# Patient Record
Sex: Male | Born: 1990 | Race: Black or African American | Hispanic: No | Marital: Single | Smoking: Never smoker
Health system: Southern US, Community
[De-identification: ages and names within clinical notes are randomized; demographics above are authoritative.]

## PROBLEM LIST (undated history)

## (undated) DIAGNOSIS — J40 Bronchitis, not specified as acute or chronic: Secondary | ICD-10-CM

## (undated) DIAGNOSIS — F99 Mental disorder, not otherwise specified: Secondary | ICD-10-CM

## (undated) DIAGNOSIS — F419 Anxiety disorder, unspecified: Secondary | ICD-10-CM

## (undated) HISTORY — PX: NO PAST SURGERIES: SHX2092

---
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
## 2013-01-23 ENCOUNTER — Encounter (HOSPITAL_COMMUNITY): Payer: Self-pay | Admitting: Emergency Medicine

## 2013-01-23 ENCOUNTER — Emergency Department (HOSPITAL_COMMUNITY)
Admission: EM | Admit: 2013-01-23 | Discharge: 2013-01-23 | Disposition: A | Discharge status: Home / Self Care | Payer: Managed Care, Other (non HMO) | Source: Home / Self Care | Attending: Emergency Medicine | Admitting: Emergency Medicine

## 2013-01-23 DIAGNOSIS — J040 Acute laryngitis: Secondary | ICD-10-CM

## 2013-01-23 LAB — POCT RAPID STREP A: Streptococcus, Group A Screen (Direct): NEGATIVE

## 2013-01-23 MED ORDER — PREDNISONE 20 MG PO TABS: 20.0000 mg | ORAL_TABLET | Freq: Two times a day (BID) | ORAL | Status: DC

## 2013-01-23 MED ORDER — BENZONATATE 200 MG PO CAPS: 200.0000 mg | ORAL_CAPSULE | Freq: Three times a day (TID) | ORAL | Status: DC | PRN

## 2013-01-23 NOTE — ED Notes (Signed)
Throat issues, seen by dr Lorenz Coaster prior to this nurse

## 2013-01-23 NOTE — ED Provider Notes (Signed)
  Chief Complaint   No chief complaint on file.   History of Present Illness   Dale Hayden is a 22 year old male who has had a four-day history of hoarseness, slight sore throat, dry cough, nasal congestion, and headache. He denies any fever, chills, sweats, muscle aches, chest tightness, wheezing, chest pain, earache, or GI symptoms. He has had no known sick exposures.  Review of Systems   Other than as noted above, the patient denies any of the following symptoms: Systemic:  No fevers, chills, sweats, or myalgias. Eye:  No redness or discharge. ENT:  No ear pain, headache, nasal congestion, drainage, sinus pressure, or sore throat. Neck:  No neck pain, stiffness, or swollen glands. Lungs:  No cough, sputum production, hemoptysis, wheezing, chest tightness, shortness of breath or chest pain. GI:  No abdominal pain, nausea, vomiting or diarrhea.  PMFSH   Past medical history, family history, social history, meds, and allergies were reviewed.   Physical exam   Vital signs:  BP 129/82  Pulse 64  Temp(Src) 97.8 F (36.6 C) (Oral)  Resp 14  SpO2 99% General:  Alert and oriented.  In no distress.  Skin warm and dry. Eye:  No conjunctival injection or drainage. Lids were normal. ENT:  TMs and canals were normal, without erythema or inflammation.  Nasal mucosa was clear and uncongested, without drainage.  Mucous membranes were moist.  Pharynx was swollen and erythematous without exudate or drainage.  There were no oral ulcerations or lesions. Neck:  Supple, no adenopathy, tenderness or mass. Lungs:  No respiratory distress.  Lungs were clear to auscultation, without wheezes, rales or rhonchi.  Breath sounds were clear and equal bilaterally.  Heart:  Regular rhythm, without gallops, murmers or rubs. Skin:  Clear, warm, and dry, without rash or lesions.   Labs   Results for orders placed during the hospital encounter of 01/23/13  POCT RAPID STREP A ([facility] URG CARE ONLY)      Result  Value Range   Streptococcus, Group A Screen (Direct) NEGATIVE  NEGATIVE    Assessment     The encounter diagnosis was Laryngitis.  No indication for antibiotics.  Plan    1.  Meds:  The following meds were prescribed:   New Prescriptions   BENZONATATE (TESSALON) 200 MG CAPSULE    Take 1 capsule (200 mg total) by mouth 3 (three) times daily as needed for cough.   PREDNISONE (DELTASONE) 20 MG TABLET    Take 1 tablet (20 mg total) by mouth 2 (two) times daily.    2.  Patient Education/Counseling:  The patient was given appropriate handouts, self care instructions, and instructed in symptomatic relief.  Instructed to get extra fluids, rest, and use a cool mist vaporizer.    3.  Follow up:  The patient was told to follow up here if no better in 3 to 4 days, or sooner if becoming worse in any way, and given some red flag symptoms such as increasing fever, difficulty breathing, chest pain, or persistent vomiting which would prompt immediate return.  Follow up here as needed.      Reuben Likes, MD 01/23/13 [phone]

## 2013-01-25 LAB — CULTURE, GROUP A STREP

## 2013-12-17 ENCOUNTER — Other Ambulatory Visit (HOSPITAL_COMMUNITY)
Admission: RE | Admit: 2013-12-17 | Discharge: 2013-12-17 | Disposition: A | Discharge status: Home / Self Care | Payer: Managed Care, Other (non HMO) | Source: Ambulatory Visit | Attending: Emergency Medicine | Admitting: Emergency Medicine

## 2013-12-17 ENCOUNTER — Emergency Department (HOSPITAL_COMMUNITY)
Admission: EM | Admit: 2013-12-17 | Discharge: 2013-12-17 | Disposition: A | Discharge status: Home / Self Care | Payer: Managed Care, Other (non HMO) | Source: Home / Self Care | Attending: Emergency Medicine | Admitting: Emergency Medicine

## 2013-12-17 ENCOUNTER — Encounter (HOSPITAL_COMMUNITY): Payer: Self-pay | Admitting: Emergency Medicine

## 2013-12-17 DIAGNOSIS — Z113 Encounter for screening for infections with a predominantly sexual mode of transmission: Secondary | ICD-10-CM | POA: Insufficient documentation

## 2013-12-17 DIAGNOSIS — L03032 Cellulitis of left toe: Secondary | ICD-10-CM

## 2013-12-17 LAB — POCT URINALYSIS DIP (DEVICE)
Bilirubin Urine: NEGATIVE
Glucose, UA: NEGATIVE mg/dL
Hgb urine dipstick: NEGATIVE
KETONES UR: NEGATIVE mg/dL
LEUKOCYTES UA: NEGATIVE
Nitrite: NEGATIVE
PH: 6.5 (ref 5.0–8.0)
PROTEIN: NEGATIVE mg/dL
SPECIFIC GRAVITY, URINE: 1.025 (ref 1.005–1.030)
UROBILINOGEN UA: 0.2 mg/dL (ref 0.0–1.0)

## 2013-12-17 MED ORDER — HYDROCODONE-ACETAMINOPHEN 5-325 MG PO TABS: 1.0000 | ORAL_TABLET | Freq: Four times a day (QID) | ORAL | Status: DC | PRN

## 2013-12-17 MED ORDER — SULFAMETHOXAZOLE-TRIMETHOPRIM 800-160 MG PO TABS: 1.0000 | ORAL_TABLET | Freq: Two times a day (BID) | ORAL | Status: AC

## 2013-12-17 MED ORDER — LIDOCAINE HCL 2 % IJ SOLN
INTRAMUSCULAR | Status: AC
  Filled 2013-12-17: qty 20

## 2013-12-17 NOTE — ED Notes (Signed)
Bacitracin ointment applied to left great toe.  Wrapped with 2  inch kling gauze.  D/c instructions reviewed.

## 2013-12-17 NOTE — Discharge Instructions (Signed)
You have an infection around your big toe. We removed the toenail.  It will grow back in 6 months. Apply neosporin to the toe and cover with a bandage for the next 2-3 days. Take the antibiotic twice a day for 10 days.  If anything comes back in your urine we will call you. Get Lamisil over the counter for the spot on your hand.  Follow up as needed.  Toenail Removal Toenails may need to be removed because of injury, infections, or to correct abnormal growth. A special non-stick bandage will likely be put tightly on your toe to prevent bleeding. Often times a new nail will grow back. Sometimes the new nail may be deformed. Most of the time when a nail is lost, it will gradually heal, but may be sensitive for a long time. HOME CARE INSTRUCTIONS   Keep your foot elevated to relieve pain and swelling. This will require lying in bed or on a couch with the leg on pillows or sitting in a recliner with the leg up. Walking or letting your leg dangle may increase swelling, slow healing, and cause throbbing pain.  Keep your bandage dry and clean.  Change your bandage in 24 hours.  After your bandage is changed, soak your foot in warm, soapy water for 10 to 20 minutes. Do this 3 times per day. This helps reduce pain and swelling. After soaking your foot apply a clean, dry bandage. Change your bandage if it is wet or dirty.  Only take over-the-counter or prescription medicines for pain, discomfort, or fever as directed by your caregiver.  See your caregiver as needed for problems. You might need a tetanus shot now if:  You have no idea when you had the last one.  You have never had a tetanus shot before.  The injured area had dirt in it. If you need a tetanus shot, and you decide not to get one, there is a rare chance of getting tetanus. Sickness from tetanus can be serious. If you did get a tetanus shot, your arm may swell, get red and warm to the touch at the shot site. This is common and not a  problem. SEEK IMMEDIATE MEDICAL CARE IF:   You have increased pain, swelling, redness, warmth, drainage, or bleeding.  You have a fever.  You have swelling that spreads from your toe into your foot. Document Released: 10/09/2002 Document Revised: 04/04/2011 Document Reviewed: 01/21/2008 [facility] Patient Information 2015 [place]. This information is not intended to replace advice given to you by your health care provider. Make sure you discuss any questions you have with your health care provider.

## 2013-12-17 NOTE — ED Provider Notes (Signed)
CSN: [id number]     Arrival date & time 12/17/13  1718 History   First MD Initiated Contact with Patient 12/17/13 1733     Chief Complaint  Patient presents with  . Toe Pain   (Consider location/radiation/quality/duration/timing/severity/associated sxs/prior Treatment) HPI  He is a 23 year old man here for evaluation of left big toe pain. It has been present for about a month. The pain is only when he touches it or bumped his foot. He has noted swelling and redness of the toe.  He denies any drainage from the toe. No fevers or chills.  He also states that several weeks ago, he had pain in his left testicle. He denies any dysuria or penile discharge at that time. He states he took some over-the-counter medication and it resolved. He would like to be tested for STDs.  Past Medical History  Diagnosis Date  . Bronchitis   . Anxiety   . Mental disorder     ADHD   Past Surgical History  Procedure Laterality Date  . No past surgeries    . I&d extremity  06/16/2011    Procedure: IRRIGATION AND DEBRIDEMENT EXTREMITY;  Surgeon: Verlee RossettiSteven R Norris, MD;  [facility];  Service: Orthopedics;  Laterality: Left;  I&D left foot with removal of foreign body   History reviewed. No pertinent family history. History  Substance Use Topics  . Smoking status: Never Smoker   . Smokeless tobacco: Never Used  . Alcohol Use: Yes     Comment: RARE    Review of Systems  Constitutional: Negative.   Gastrointestinal: Negative.   Genitourinary: Negative for dysuria, discharge, penile pain and testicular pain.  Musculoskeletal:       Left great toe pain/swelling    Allergies  Peanut-containing drug products  Home Medications   Prior to Admission medications   Medication Sig Start Date End Date Taking? Authorizing Provider  benzonatate (TESSALON) 200 MG capsule Take 1 capsule (200 mg total) by mouth 3 (three) times daily as needed for cough. 01/23/13   Reuben Likesavid C Keller, MD  HYDROcodone-acetaminophen  (NORCO) 5-325 MG per tablet Take 1 tablet by mouth every 6 (six) hours as needed for moderate pain. 12/17/13   Charm RingsErin J Jayana Kotula, MD  menthol-cetylpyridinium (CEPACOL) 3 MG lozenge Take 1 lozenge by mouth as needed for sore throat.    Historical Provider, MD  predniSONE (DELTASONE) 20 MG tablet Take 1 tablet (20 mg total) by mouth 2 (two) times daily. 01/23/13   Reuben Likesavid C Keller, MD  sulfamethoxazole-trimethoprim (BACTRIM DS,SEPTRA DS) 800-160 MG per tablet Take 1 tablet by mouth 2 (two) times daily. 12/17/13 12/24/13  Charm RingsErin J Samhita Kretsch, MD  Zinc 10 MG LOZG Use as directed in the mouth or throat.    Historical Provider, MD   BP 125/73 mmHg  Pulse 73  Temp(Src) 98.1 F (36.7 C) (Oral)  Resp 18  SpO2 99% Physical Exam  Constitutional: He is oriented to person, place, and time. He appears well-developed and well-nourished. No distress.  Cardiovascular: Normal rate.   Pulmonary/Chest: Effort normal.  Neurological: He is alert and oriented to person, place, and time.  Skin:  Left great toe with swelling and erythema at medial and lateral nail bed as well as cuticle    ED Course  NAIL REMOVAL Date/Time: 12/17/2013 6:39 PM Performed by: Charm RingsHONIG, Linville Decarolis J Authorized by: Charm RingsHONIG, Lovetta Condie J Consent: Verbal consent obtained. Risks and benefits: risks, benefits and alternatives were discussed Consent given by: patient Patient understanding: patient states understanding of the  procedure being performed Patient identity confirmed: verbally with patient Time out: Immediately prior to procedure a "time out" was called to verify the correct patient, procedure, equipment, support staff and site/side marked as required. Location: left foot Location details: left big toe Anesthesia: digital block Local anesthetic: lidocaine 2% without epinephrine Anesthetic total: 4 ml Preparation: skin prepped with Betadine Amount removed: complete Nail bed sutured: no Dressing: antibiotic ointment and 4x4 Patient tolerance: Patient  tolerated the procedure well with no immediate complications   (including critical care time) Labs Review Labs Reviewed  POCT URINALYSIS DIP (DEVICE)  URINE CYTOLOGY ANCILLARY ONLY    Imaging Review No results found.   MDM   1. Paronychia of great toe, left   2. Screen for STD (sexually transmitted disease)    Left great toenail removed given surrounding infection. We'll treat with Bactrim for 10 days. Wound care instructions given. Urine sent for gonorrhea and chlamydia. Will call if anything is positive. Follow-up as needed.    Charm RingsErin J Geovannie Vilar, MD 12/17/13 [phone]

## 2013-12-17 NOTE — ED Notes (Signed)
23 year old male here to Urgent Care for c/o Left great toe swelling.  States that he really is not having any real symptoms but has noticed it looking swollen for about a month.  No known injury and  He denies any pain at all.

## 2013-12-20 LAB — URINE CYTOLOGY ANCILLARY ONLY
Chlamydia: NEGATIVE
NEISSERIA GONORRHEA: NEGATIVE
TRICH (WINDOWPATH): NEGATIVE

## 2014-05-20 ENCOUNTER — Encounter (HOSPITAL_COMMUNITY): Payer: Self-pay | Admitting: Emergency Medicine

## 2014-05-20 ENCOUNTER — Emergency Department (HOSPITAL_COMMUNITY)
Admission: EM | Admit: 2014-05-20 | Discharge: 2014-05-20 | Disposition: A | Discharge status: Home / Self Care | Payer: Managed Care, Other (non HMO) | Source: Home / Self Care | Attending: Family Medicine | Admitting: Family Medicine

## 2014-05-20 DIAGNOSIS — R059 Cough, unspecified: Secondary | ICD-10-CM

## 2014-05-20 DIAGNOSIS — R05 Cough: Secondary | ICD-10-CM | POA: Diagnosis not present

## 2014-05-20 MED ORDER — ALBUTEROL SULFATE HFA 108 (90 BASE) MCG/ACT IN AERS: 2.0000 | INHALATION_SPRAY | Freq: Four times a day (QID) | RESPIRATORY_TRACT | Status: AC | PRN

## 2014-05-20 MED ORDER — PREDNISONE 10 MG PO TABS: 30.0000 mg | ORAL_TABLET | Freq: Every day | ORAL | Status: DC

## 2014-05-20 NOTE — ED Notes (Signed)
Pt complains of "itching inside my chest, my voice changes with cold water" and a cough.  Pt states this has happened before but he didn't have the problem with his voice.  Pt tried Mucinex, with no relief.

## 2014-05-20 NOTE — ED Provider Notes (Signed)
Dale Hayden is a 24 y.o. male who presents to Urgent Care today for cough. Patient has a one-month history of cough associated with a tickling sensation in his chest and wheezing. He's tried Mucinex which has not helped. He's had similar symptoms in the past. He denies any chest pains palpitations or shortness of breath. He feels well otherwise. Cough is nonproductive.   Past Medical History  Diagnosis Date  . Bronchitis   . Anxiety   . Mental disorder     ADHD   Past Surgical History  Procedure Laterality Date  . No past surgeries    . I&d extremity  06/16/2011    Procedure: IRRIGATION AND DEBRIDEMENT EXTREMITY;  Surgeon: Verlee RossettiSteven R Norris, MD;  [facility];  Service: Orthopedics;  Laterality: Left;  I&D left foot with removal of foreign body   History  Substance Use Topics  . Smoking status: Never Smoker   . Smokeless tobacco: Never Used  . Alcohol Use: Yes     Comment: RARE   ROS as above Medications: No current facility-administered medications for this encounter.   Current Outpatient Prescriptions  Medication Sig Dispense Refill  . albuterol (PROVENTIL HFA;VENTOLIN HFA) 108 (90 BASE) MCG/ACT inhaler Inhale 2 puffs into the lungs every 6 (six) hours as needed for wheezing or shortness of breath. 1 Inhaler 2  . predniSONE (DELTASONE) 10 MG tablet Take 3 tablets (30 mg total) by mouth daily. 15 tablet 0  . Zinc 10 MG LOZG Use as directed in the mouth or throat.     Allergies  Allergen Reactions  . Peanut-Containing Drug Products Anaphylaxis, Itching and Nausea And Vomiting     Exam:  BP 125/75 mmHg  Pulse 56  Temp(Src) 98 F (36.7 C) (Oral)  Resp 18  SpO2 100% Gen: Well NAD HEENT: EOMI,  MMM is to refer cobblestoning. Normal tympanic membranes bilaterally. Lungs: Normal work of breathing. CTABL Heart: RRR no MRG Abd: NABS, Soft. Nondistended, Nontender Exts: Brisk capillary refill, warm and well perfused.   No results found for this or any previous visit  (from the past 24 hour(s)). No results found.  Assessment and Plan: 24 y.o. male with month-long cough. Asthma versus postnasal drip versus allergies. Treat with Rhinocort nasal spray, Zyrtec albuterol and prednisone.  Discussed warning signs or symptoms. Please see discharge instructions. Patient expresses understanding.     Rodolph BongEvan S Satin Boal, MD 05/20/14 2113

## 2014-05-20 NOTE — Discharge Instructions (Signed)
Thank you for coming in today. Call or go to the emergency room if you get worse, have trouble breathing, have chest pains, or palpitations.  Take prednisone daily for 45 days. Use albuterol as needed. Use Rhinocort and Zyrtec. Follow-up with primary care doctor if you don't get better.   Cough, Adult  A cough is a reflex that helps clear your throat and airways. It can help heal the body or may be a reaction to an irritated airway. A cough may only last 2 or 3 weeks (acute) or may last more than 8 weeks (chronic).  CAUSES Acute cough:  Viral or bacterial infections. Chronic cough:  Infections.  Allergies.  Asthma.  Post-nasal drip.  Smoking.  Heartburn or acid reflux.  Some medicines.  Chronic lung problems (COPD).  Cancer. SYMPTOMS   Cough.  Fever.  Chest pain.  Increased breathing rate.  High-pitched whistling sound when breathing (wheezing).  Colored mucus that you cough up (sputum). TREATMENT   A bacterial cough may be treated with antibiotic medicine.  A viral cough must run its course and will not respond to antibiotics.  Your caregiver may recommend other treatments if you have a chronic cough. HOME CARE INSTRUCTIONS   Only take over-the-counter or prescription medicines for pain, discomfort, or fever as directed by your caregiver. Use cough suppressants only as directed by your caregiver.  Use a cold steam vaporizer or humidifier in your bedroom or home to help loosen secretions.  Sleep in a semi-upright position if your cough is worse at night.  Rest as needed.  Stop smoking if you smoke. SEEK IMMEDIATE MEDICAL CARE IF:   You have pus in your sputum.  Your cough starts to worsen.  You cannot control your cough with suppressants and are losing sleep.  You begin coughing up blood.  You have difficulty breathing.  You develop pain which is getting worse or is uncontrolled with medicine.  You have a fever. MAKE SURE YOU:    Understand these instructions.  Will watch your condition.  Will get help right away if you are not doing well or get worse. Document Released: 07/09/2010 Document Revised: 04/04/2011 Document Reviewed: 07/09/2010 [facility] Patient Information 2015 [place], [place]. This information is not intended to replace advice given to you by your health care provider. Make sure you discuss any questions you have with your health care provider.  PRIMARY CARE Merchant navy officer at [employer] [address]  [place], [place] [phone]  Fax [phone]  Nature conservation officer at [facility] [address]  [place], [place] Ph [phone]  Fax [phone]  Nature conservation officer at [place] [facility] [place] [phone] [place], [place] Ph [phone]  Fax [phone]  [facility] at [facility] [address]  [place], [place] Ph [phone]  Fax [phone]  [employer] [facility] [address] [place] [address] [place], [place] Ph [phone]  Fax [phone]  [facility] at [facility] [address] [place], [place] Ph [phone]  Fax [phone]   [facility] Medicine @ [place] [address] [place] [address] Phone: [phone]   [facility] @ [facility] [address] [place] [address] Phone: [phone]   [facility] Medicine @ [place] [address] [place] [address] Phone: [phone]   [facility] @ [place] [address]  [place] [address] Phone: [phone]   [facility] @ [place] [address] [place] [address] Phone: [phone] Fax: [phone]   [facility] @ [place] [address] [place] [address] Phone: [phone]   Dr. Maryelizabeth RowanElizabeth Dewey [address] [place] [address] [phone]

## 2014-09-12 ENCOUNTER — Emergency Department (HOSPITAL_COMMUNITY)
Admission: EM | Admit: 2014-09-12 | Discharge: 2014-09-12 | Disposition: A | Discharge status: Home / Self Care | Payer: Managed Care, Other (non HMO) | Source: Home / Self Care | Attending: Family Medicine | Admitting: Family Medicine

## 2014-09-12 ENCOUNTER — Encounter (HOSPITAL_COMMUNITY): Payer: Self-pay | Admitting: Emergency Medicine

## 2014-09-12 DIAGNOSIS — L600 Ingrowing nail: Secondary | ICD-10-CM

## 2014-09-12 DIAGNOSIS — K1379 Other lesions of oral mucosa: Secondary | ICD-10-CM | POA: Diagnosis not present

## 2014-09-12 MED ORDER — CETIRIZINE HCL 10 MG PO TABS: 10.0000 mg | ORAL_TABLET | Freq: Every day | ORAL | Status: AC

## 2014-09-12 NOTE — Discharge Instructions (Signed)
Take pill for allergy daily and see each specialist as you are able.

## 2014-09-12 NOTE — ED Notes (Signed)
Patient has multiple complains.  Complains of left ingrown toenail and concerns for uvula is longer than usual.  "feels weird"

## 2014-09-12 NOTE — ED Provider Notes (Signed)
CSN: [id number]     Arrival date & time 09/12/14  1842 History   First MD Initiated Contact with Patient 09/12/14 1911     No chief complaint on file.  (Consider location/radiation/quality/duration/timing/severity/associated sxs/prior Treatment) Patient is a 24 y.o. male presenting with mouth sores and toe pain. The history is provided by the patient.  Mouth Lesions Location:  Palate Palate location:  Soft Onset quality:  Gradual Severity:  Mild Duration:  3 days Progression:  Unchanged Chronicity:  New Relieved by:  None tried Worsened by:  Nothing tried Ineffective treatments:  None tried Associated symptoms: no fever   Associated symptoms comment:  Uvular elongation. Toe Pain This is a recurrent problem. The current episode started more than 1 week ago (ingrown left gt toenail, h/o similar problem now recurrent.). The problem has been gradually worsening. The symptoms are aggravated by walking.    Past Medical History  Diagnosis Date  . Bronchitis   . Anxiety   . Mental disorder     ADHD   Past Surgical History  Procedure Laterality Date  . No past surgeries    . I&d extremity  06/16/2011    Procedure: IRRIGATION AND DEBRIDEMENT EXTREMITY;  Surgeon: Verlee Rossetti, MD;  Location: [facility];  Service: Orthopedics;  Laterality: Left;  I&D left foot with removal of foreign body   No family history on file. Social History  Substance Use Topics  . Smoking status: Never Smoker   . Smokeless tobacco: Never Used  . Alcohol Use: Yes     Comment: RARE    Review of Systems  Constitutional: Negative for fever.  HENT: Positive for mouth sores and trouble swallowing. Negative for voice change.   Musculoskeletal: Positive for gait problem.    Allergies  Peanut-containing drug products  Home Medications   Prior to Admission medications   Medication Sig Start Date End Date Taking? Authorizing Provider  albuterol (PROVENTIL HFA;VENTOLIN HFA) 108 (90 BASE) MCG/ACT inhaler  Inhale 2 puffs into the lungs every 6 (six) hours as needed for wheezing or shortness of breath. 05/20/14   Rodolph Bong, MD  cetirizine (ZYRTEC) 10 MG tablet Take 1 tablet (10 mg total) by mouth daily. One tab daily for allergies 09/12/14   Linna Hoff, MD  predniSONE (DELTASONE) 10 MG tablet Take 3 tablets (30 mg total) by mouth daily. 05/20/14   Rodolph Bong, MD  Zinc 10 MG LOZG Use as directed in the mouth or throat.    Historical Provider, MD   BP 101/64 mmHg  Pulse 88  Temp(Src) 98.2 F (36.8 C) (Oral)  Resp 16  SpO2 96% Physical Exam  Constitutional: He is oriented to person, place, and time. He appears well-developed and well-nourished. No distress.  HENT:  Mouth/Throat:    Neck: Normal range of motion. Neck supple.  Musculoskeletal: He exhibits tenderness.  Noninfected left ingrown toenail.  Lymphadenopathy:    He has no cervical adenopathy.  Neurological: He is alert and oriented to person, place, and time.  Skin: Skin is warm and dry.  Nursing note and vitals reviewed.   ED Course  Procedures (including critical care time) Labs Review Labs Reviewed - No data to display  Imaging Review No results found.   MDM   1. Ingrown left greater toenail   2. Uvular hypertrophy        Linna Hoff, MD 09/12/14 [id number]

## 2015-01-03 ENCOUNTER — Emergency Department (HOSPITAL_COMMUNITY): Payer: Managed Care, Other (non HMO)

## 2015-01-03 ENCOUNTER — Encounter (HOSPITAL_COMMUNITY): Payer: Self-pay | Admitting: Family Medicine

## 2015-01-03 ENCOUNTER — Emergency Department (HOSPITAL_COMMUNITY)
Admission: EM | Admit: 2015-01-03 | Discharge: 2015-01-03 | Disposition: A | Discharge status: Home / Self Care | Payer: Managed Care, Other (non HMO) | Attending: Emergency Medicine | Admitting: Emergency Medicine

## 2015-01-03 DIAGNOSIS — Z792 Long term (current) use of antibiotics: Secondary | ICD-10-CM | POA: Diagnosis not present

## 2015-01-03 DIAGNOSIS — L600 Ingrowing nail: Secondary | ICD-10-CM | POA: Insufficient documentation

## 2015-01-03 DIAGNOSIS — Z791 Long term (current) use of non-steroidal anti-inflammatories (NSAID): Secondary | ICD-10-CM | POA: Diagnosis not present

## 2015-01-03 DIAGNOSIS — Z8709 Personal history of other diseases of the respiratory system: Secondary | ICD-10-CM | POA: Insufficient documentation

## 2015-01-03 DIAGNOSIS — Z8659 Personal history of other mental and behavioral disorders: Secondary | ICD-10-CM | POA: Insufficient documentation

## 2015-01-03 DIAGNOSIS — Z79899 Other long term (current) drug therapy: Secondary | ICD-10-CM | POA: Diagnosis not present

## 2015-01-03 MED ORDER — CEPHALEXIN 500 MG PO CAPS: 500.0000 mg | ORAL_CAPSULE | Freq: Four times a day (QID) | ORAL | Status: DC

## 2015-01-03 MED ORDER — OXYCODONE-ACETAMINOPHEN 5-325 MG PO TABS: 1.0000 | ORAL_TABLET | ORAL | Status: AC | PRN

## 2015-01-03 MED ORDER — OXYCODONE-ACETAMINOPHEN 5-325 MG PO TABS
1.0000 | ORAL_TABLET | Freq: Once | ORAL | Status: AC
  Administered 2015-01-03: 1 via ORAL
  Filled 2015-01-03: qty 1

## 2015-01-03 MED ORDER — BUPIVACAINE HCL (PF) 0.5 % IJ SOLN
10.0000 mL | Freq: Once | INTRAMUSCULAR | Status: DC
  Filled 2015-01-03: qty 10

## 2015-01-03 MED ORDER — IBUPROFEN 800 MG PO TABS: 800.0000 mg | ORAL_TABLET | Freq: Three times a day (TID) | ORAL | Status: AC

## 2015-01-03 MED ORDER — BACITRACIN ZINC 500 UNIT/GM EX OINT
TOPICAL_OINTMENT | Freq: Two times a day (BID) | CUTANEOUS | Status: DC
  Administered 2015-01-03: 1 via TOPICAL

## 2015-01-03 NOTE — ED Provider Notes (Signed)
CSN: [id number]     Arrival date & time 01/03/15  1745 History   First MD Initiated Contact with Patient 01/03/15 1756     Chief Complaint  Patient presents with  . Ingrown Toenail     (Consider location/radiation/quality/duration/timing/severity/associated sxs/prior Treatment) HPI   Dale Hayden is a 24 y.o. male, with a history of ingrown toenails, presenting to the ED with an ingrown toenail of the right great toe the patient states has been there for over a month. Patient states that he went to an urgent care center in November and had part of the same toenail removed, but when the toenail grow back a group back into the skin again. Patient also complains of pain to this area, describes it as throbbing, rates it 3 out of 10, nonradiating. Patient denies neurologic deficit, fever/chills, nausea or vomiting, difficulty walking, or any other pain or complaints. Patient is accompanied by his mother at the bedside.  Past Medical History  Diagnosis Date  . Bronchitis   . Anxiety   . Mental disorder     ADHD   Past Surgical History  Procedure Laterality Date  . No past surgeries    . I&d extremity  06/16/2011    Procedure: IRRIGATION AND DEBRIDEMENT EXTREMITY;  Surgeon: Verlee Rossetti, MD;  [facility];  Service: Orthopedics;  Laterality: Left;  I&D left foot with removal of foreign body   History reviewed. No pertinent family history. Social History  Substance Use Topics  . Smoking status: Never Smoker   . Smokeless tobacco: Never Used  . Alcohol Use: Yes     Comment: RARE    Review of Systems  Skin:       Ingrown toenail      Allergies  Peanut-containing drug products  Home Medications   Prior to Admission medications   Medication Sig Start Date End Date Taking? Authorizing Provider  albuterol (PROVENTIL HFA;VENTOLIN HFA) 108 (90 BASE) MCG/ACT inhaler Inhale 2 puffs into the lungs every 6 (six) hours as needed for wheezing or shortness of breath. 05/20/14   Rodolph Bong, MD  cephALEXin (KEFLEX) 500 MG capsule Take 1 capsule (500 mg total) by mouth 4 (four) times daily. 01/03/15   Lukisha Procida C Nyanna Heideman, PA-C  cetirizine (ZYRTEC) 10 MG tablet Take 1 tablet (10 mg total) by mouth daily. One tab daily for allergies 09/12/14   Linna Hoff, MD  ibuprofen (ADVIL,MOTRIN) 800 MG tablet Take 1 tablet (800 mg total) by mouth 3 (three) times daily. 01/03/15   Noa Constante C Aanyah Loa, PA-C  oxyCODONE-acetaminophen (PERCOCET/ROXICET) 5-325 MG tablet Take 1-2 tablets by mouth every 4 (four) hours as needed for severe pain. 01/03/15   Atara Paterson C Mishaal Lansdale, PA-C  predniSONE (DELTASONE) 10 MG tablet Take 3 tablets (30 mg total) by mouth daily. Patient not taking: Reported on 09/12/2014 05/20/14   Rodolph Bong, MD  Zinc 10 MG LOZG Use as directed in the mouth or throat.    Historical Provider, MD   BP 113/63 mmHg  Pulse 70  Temp(Src) 97.6 F (36.4 C) (Oral)  Resp 16  SpO2 100% Physical Exam  Constitutional: He appears well-developed and well-nourished. No distress.  Cardiovascular: Normal rate, regular rhythm and intact distal pulses.   Musculoskeletal:  Patient has full range of motion in his left foot and toes. Swelling noted on the lateral side of the left great toe with an area of fluctuance noted next to the nail bed on the lateral side. The erythema and  warmth as well as some dried blood noted to the same area.  Neurological: He is alert.  No numbness, tingling, or weakness noted.  Skin: Skin is warm and dry.  Nursing note and vitals reviewed.   ED Course  .Marland KitchenIncision and Drainage Date/Time: 01/03/2015 6:25 PM Performed by: Anselm Pancoast Authorized by: Harolyn Rutherford C Consent: Verbal consent obtained. Risks and benefits: risks, benefits and alternatives were discussed Consent given by: patient Patient understanding: patient states understanding of the procedure being performed Patient consent: the patient's understanding of the procedure matches consent given Procedure consent: procedure  consent matches procedure scheduled Patient identity confirmed: verbally with patient and arm band Time out: Immediately prior to procedure a "time out" was called to verify the correct patient, procedure, equipment, support staff and site/side marked as required. Type: hematoma Body area: lower extremity Location details: left big toe Anesthesia: digital block Local anesthetic: bupivacaine 0.5% without epinephrine Anesthetic total: 2 ml Patient sedated: no Scalpel size: 11 Incision type: single straight Incision depth: subcutaneous Complexity: simple Wound treatment: wound left open Packing material: Vaseline gauze Patient tolerance: Patient tolerated the procedure well with no immediate complications  .Nail Removal Date/Time: 01/03/2015 6:26 PM Performed by: Anselm Pancoast Authorized by: Harolyn Rutherford C Consent: Verbal consent obtained. Risks and benefits: risks, benefits and alternatives were discussed Consent given by: patient Patient understanding: patient states understanding of the procedure being performed Patient consent: the patient's understanding of the procedure matches consent given Procedure consent: procedure consent matches procedure scheduled Patient identity confirmed: verbally with patient and arm band Time out: Immediately prior to procedure a "time out" was called to verify the correct patient, procedure, equipment, support staff and site/side marked as required. Location: left foot Location details: left big toe Anesthesia: digital block Local anesthetic: bupivacaine 0.5% without epinephrine Anesthetic total: 2 ml Patient sedated: no Preparation: skin prepped with Betadine Amount removed: 1/3 Nail bed sutured: no Nail matrix removed: partial Removed nail replaced and anchored: no Dressing: antibiotic ointment, Xeroform gauze and 4x4 Patient tolerance: Patient tolerated the procedure well with no immediate complications   (including critical care  time) Labs Review Labs Reviewed - No data to display  Imaging Review Dg Foot Complete Left  01/03/2015  CLINICAL DATA:  Ingrown toenail on great toe with surrounding infection. EXAM: LEFT FOOT - COMPLETE 3+ VIEW COMPARISON:  Radiographs 06/16/2011 FINDINGS: Mild soft tissue edema about the great toe. No soft tissue air, radiopaque foreign body, or S subjacent osseous abnormality. No fracture, dislocation, or bony destructive change. Previous lucency involving the plantar soft tissues has resolved. IMPRESSION: Soft tissue edema about the great toe.  No osseous abnormality. Electronically Signed   By: Rubye Oaks M.D.   On: 01/03/2015 19:11   I have personally reviewed and evaluated these images and lab results as part of my medical decision-making.   EKG Interpretation None      MDM   Final diagnoses:  Ingrown left big toenail    Norvel Richards presents with an ingrown toenail and possible abscess.  This patient has an ingrown toenail with possible associated abscess or blood collection. Draining of the collection is necessary as well as removal of part of the toenail. Part of the toenail needed to be removed and area of swelling turn out to be a hematoma, which was drained successfully. Patient was instructed that he would need to follow-up with PCP in 3 days time for a wound check as well as establishing himself with a primary  provider. Patient was also instructed on the importance of finishing the entire antibiotic course. Patient voiced understanding of these instructions, agreed to the plan, and is comfortable with discharge.  Anselm PancoastShawn C Stevan Eberwein, PA-C 01/03/15 1934  Raeford RazorStephen Kohut, MD 01/03/15 2249

## 2015-01-03 NOTE — ED Notes (Signed)
Wound placed on pt toe with bacitracin.

## 2015-01-03 NOTE — Discharge Instructions (Signed)
You have been seen today for an ingrown toenail. Your imaging showed no abnormalities. Follow up with PCP in 3 days for a wound check. Return to ED should symptoms worsen. Soak the affected foot in warm, soapy water for 10 to 20 minutes, three times per day for one to two weeks, pushing the lateral nailfold away from the nail plate. Alternatively, a solution of water mixed with 1 to 2 teaspoons of Epsom salts can be used.   Emergency Department Resource Guide 1) Find a Doctor and Pay Out of Pocket Although you won't have to find out who is covered by your insurance plan, it is a good idea to ask around and get recommendations. You will then need to call the office and see if the doctor you have chosen will accept you as a new patient and what types of options they offer for patients who are self-pay. Some doctors offer discounts or will set up payment plans for their patients who do not have insurance, but you will need to ask so you aren't surprised when you get to your appointment.  2) Contact Your Local Health Department Not all health departments have doctors that can see patients for sick visits, but many do, so it is worth a call to see if yours does. If you don't know where your local health department is, you can check in your phone book. The CDC also has a tool to help you locate your state's health department, and many state websites also have listings of all of their local health departments.  3) Find a Walk-in Clinic If your illness is not likely to be very severe or complicated, you may want to try a walk in clinic. These are popping up all over the country in pharmacies, drugstores, and shopping centers. They're usually staffed by nurse practitioners or physician assistants that have been trained to treat common illnesses and complaints. They're usually fairly quick and inexpensive. However, if you have serious medical issues or chronic medical problems, these are probably not your best  option.  No Primary Care Doctor: - Call Health Connect at  [phone] - they can help you locate a primary care doctor that  accepts your insurance, provides certain services, etc. - Physician Referral Service- [phone]  Chronic Pain Problems: Organization         Address  Phone   Notes  [facility]  [phone] Patients need to be referred by their primary care doctor.   Medication Assistance: Organization         Address  Phone   Notes  [facility] [address] [place], [place] [address] [phone] --Must be a resident of [facility] [place] -- Must have NO insurance coverage whatsoever (no Medicaid/ Medicare, etc.) -- The pt. MUST have a primary care doctor that directs their care regularly and follows them in the community   Med[facility]ist  [phone]   [employer]  [phone]    Agencies that provide inexpensive medical care: Organization         Address  Phone   Notes  [facility]  [phone]   [facility]    [phone]   [facility] [address] [place], [place] [address] [phone]   [facility] [address], [place] [phone]   [facility]    [phone]   [facility]    [phone]  [phone]   [place] [address], [place] Phone:  [phone], Fax:  [phone] Hours of Operation:  9 am - 6 pm, M-F.  Also accepts Medicaid/Medicare and self-pay.  [facility] for [place], [address], [place] Phone: [phone], Fax: [phone]. Hours of Operation:  8:30 am - 5:30 pm, M-F.  Also accepts Medicaid and self-pay.  [facility] [address], [place] Phone: [phone]   [place], [place], [place] [phone], Ext. [phone] Mondays & Thursdays: 7-9 AM.  First 15  patients are seen on a first come, first serve basis.    [place] Providers:  Organization         Address  Phone   Notes  [facility] [address], [place] [phone] Also accepts self-pay patients.  [facility] [address], [place]  [phone]   [place], [address], [place] [phone]   [facility] [address] [place] [phone]   Lucianne Lei [address], [place]   [phone] Only accepts [place] Access [place] patients after they have their name applied to their card.   Self-Pay (no insurance) in [facility]:  Organization         Address  Phone   Notes  Sickle Cell Patients, [facility] [place] [phone]   [facility] [place] [phone]   [facility] [place]  [place], [place], [place] [phone]   [facility]/Dr. Osei-Bonsu  [address], [place] or [address], [place] [phone] Phone number for both [place] and [place] locations is the same.  Urgent Medical and [facility] [address] [place] [phone]   [facility] [address] [place] or [address] [phone]   [facility] [address] [place] [phone], phone; [phone], fax Sees patients 1st and 3rd Saturday of every month.  Must not qualify for public or private insurance (i.e. Medicaid, Medicare, [place] Health Choice, Veterans' Benefits)  Household income should be no more than 200% of the poverty level The clinic cannot treat you if you are pregnant or think you are pregnant  Sexually transmitted diseases are not treated at the clinic.    Dental  Care: Organization         Address  Phone  Notes  [facility] [place] [phone] Accepts children up to age 46 who are enrolled in [place] or [place]; pregnant women with a Medicaid card; and children who have applied for Medicaid or [place] Health Choice, but were declined, whose parents can pay a reduced fee at time of service.  [facility]  [address], [place] [phone] Accepts children up to age 35 who are enrolled in [place] or [place]; pregnant women with a Medicaid card; and children who have applied for Medicaid or [place] Health Choice, but were declined, whose parents can pay a reduced fee at time of service.  [place]  Access PROGRAM  [place] [phone] Patients are seen by appointment only. Walk-ins are not accepted. [place] will see patients 53 years of age and older. Monday - Tuesday (8am-5pm) Most Wednesdays (8:30-5pm) $30 per visit, cash only  [facility] Access PROGRAM  [address], [facility] [phone] Patients are seen by appointment only. Walk-ins are not accepted. [place] will see patients 54 years of age and older. One Wednesday Evening (Monthly: Volunteer Based).  $30 per visit, cash only  [place]  [phone] for adults; Children under age 34, call Graduate Pediatric Dentistry at [phone]. Children aged 25-14, please call [phone] to request a pediatric application.  Dental services are provided in all areas of dental care including fillings, crowns and bridges, complete and partial dentures, implants, gum treatment, root canals, and extractions. Preventive care is also provided. Treatment is provided to both adults and children. Patients are selected via a lottery and there is often a waiting list.   [facility] [address] [place]  [phone] [email or web address]   [facility] [address] [place], [place] [phone], Ext. [phone] Second and Fourth Thursday of each month, opens at 6:30 AM; Clinic ends at 9 AM.  Patients are seen on a first-come first-served basis, and a limited number are seen during each clinic.   [facility]  [address] Hillard Danker [place], [place] [phone]   Eligibility Requirements You must have lived in [place], [place], or [place] counties for at least the last three months.   You cannot be eligible for state or federal sponsored [employer], including [employer], [place], or [employer].   You generally cannot be eligible for healthcare insurance through your employer.    How to apply: Eligibility screenings are held every Tuesday and Wednesday afternoon from 1:00 pm until 4:00 pm. You do not need an appointment for the interview!  [facility] [address], [place], [place]   [place]  [place] Department  [place]  [phone]    Behavioral Health Resources in the Community: Intensive Outpatient Programs Organization         Address  Phone  Notes  [facility] [place]. [address], [place], [place] [phone]   [facility] Outpatient [address] [place], [place]   ADS: Alcohol & Drug Svcs [address] [place], [place]   [place] [address],  [place], [place] or [phone]   Substance Abuse Resources Organization         Address  Phone  Notes  Alcohol and Drug Services  [phone]   [place]  [phone]   [facility] [place]   Chinita Pester  [phone]   Residential & Outpatient Substance Abuse Program  [phone]    Psychological Services Organization         Address  Phone  Notes  [facility] [address] [place]  [place]  [phone]   [place] [address] [place] or [phone]    Mobile Crisis Teams Organization         Address  Phone  Notes  [facility], Mobile Crisis Care Unit  [phone]   [facility]  [address] [place],  [place] [address] [place] [phone]    Self-Help/Support Groups Organization         Address  Phone             Notes  [facility] [place] - variety of support groups  [place] Call for more information  Narcotics Anonymous (NA), [facility] [address], [employer] [place]  2 meetings at this location   Special educational needs teacher         Address  Phone  Notes  [facility] Residential Treatment [place],    [place]  [phone]   [facility]  [address], [place] [address]371, [place], [place]   [place], [place] [phone] Admissions: 8am-3pm M-F  Incentives Substance [place] [address],    [place], [place] [phone]   [facility] [address] [place], [place], [place]   [facility] [address]  [place], [place]   [facility] - Intensive Outpatient [address] Kristeen [address], [place], [place]   [facility] ([place].,  [place], [place] [phone] or [phone]   [facility] Services (RTS) [address] [place], [place] Accepts Medicaid  [facility] [place] [address],  [place] [phone] Substance Abuse/Addiction Treatment   [facility] Organization         Address  Phone  Notes  [facility]  [phone]   Domenic Schwab, PhD [address] Arlis Porta [place], [place]   [phone] or [phone]   [place], [place] [phone]   [facility] [address] [place], [place] [phone] Insurance/Medicaid/sponsorship through [facility] and Families [address], [address] [place]                                    [place], [place] [phone] [place] [address], [place] [phone]    Dr. Adele Schilder  [phone]   [facility] Dept. 1) [address] [place] 2) [facility] 3)  [place] [address], [place] [phone]  [phone]   [place] [phone] or [phone] (After Hours)

## 2015-01-03 NOTE — ED Notes (Signed)
Pt here for ingrown toenail to left great toe. sts this has been a problem for a while. Pt toe slightly swollen with blood surrounding. Pt sts like that since April and actually improved.

## 2015-01-08 ENCOUNTER — Encounter (HOSPITAL_COMMUNITY): Payer: Self-pay | Admitting: Emergency Medicine

## 2015-01-08 ENCOUNTER — Emergency Department (HOSPITAL_COMMUNITY)
Admission: EM | Admit: 2015-01-08 | Discharge: 2015-01-08 | Disposition: A | Discharge status: Home / Self Care | Payer: Managed Care, Other (non HMO) | Attending: Emergency Medicine | Admitting: Emergency Medicine

## 2015-01-08 DIAGNOSIS — Z8709 Personal history of other diseases of the respiratory system: Secondary | ICD-10-CM | POA: Diagnosis not present

## 2015-01-08 DIAGNOSIS — Z4800 Encounter for change or removal of nonsurgical wound dressing: Secondary | ICD-10-CM | POA: Diagnosis present

## 2015-01-08 DIAGNOSIS — Z8659 Personal history of other mental and behavioral disorders: Secondary | ICD-10-CM | POA: Diagnosis not present

## 2015-01-08 DIAGNOSIS — Z79899 Other long term (current) drug therapy: Secondary | ICD-10-CM | POA: Diagnosis not present

## 2015-01-08 DIAGNOSIS — Z791 Long term (current) use of non-steroidal anti-inflammatories (NSAID): Secondary | ICD-10-CM | POA: Insufficient documentation

## 2015-01-08 DIAGNOSIS — L600 Ingrowing nail: Secondary | ICD-10-CM

## 2015-01-08 NOTE — Discharge Instructions (Signed)
Fingernail or Toenail Removal, Care After Refer to this sheet in the next few weeks. These instructions provide you with information about caring for yourself after your procedure. Your health care provider may also give you more specific instructions. Your treatment has been planned according to current medical practices, but problems sometimes occur. Call your health care provider if you have any problems or questions after your procedure. WHAT TO EXPECT AFTER THE PROCEDURE After your procedure, it is common to have:  Redness.  Swelling. HOME CARE INSTRUCTIONS  If you have a splint on your finger:  Wear it as directed by your health care provider. Remove it only as directed by your health care provider.  Loosen the splint if your fingers become numb and tingle, or if they turn cold and blue.  If you were given a surgical shoe, wear it as directed by your health care provider.  Take medicines only as directed by your health care provider.  Elevate your hand or foot as much of the time as possible. This helps with pain and swelling.  If you are recovering from fingernail removal, keep your hand raised above the level of your heart.  If you are recovering from toenail removal, lie on a bed or a couch with your leg propped up on pillows, or sit in a reclining chair with the footrest up.  Follow instructions from your health care provider about bandage (dressing) changes and removal:  Change your dressing 24 hours after your procedure or as directed by your health care provider.  Soak your hand or foot in warm, soapy water for 10-20 minutes or as directed by your health care provider. Do this 3 times per day or as directed by your health care provider. This reduces pain and swelling.  After you soak your hand or foot, apply a clean, dry dressing.  Keep your dressing clean and dry. Change your dressing whenever it gets wet or dirty.  Keep all follow-up visits as directed by your  health care provider. This is important. SEEK MEDICAL CARE IF:  You have increased redness or pain at your nail area.  You have increased fluid, blood, or pus coming from your nail area.  There is a bad smell coming from the dressing.  You have a fever.  Your swelling gets worse, or you have swelling that spreads from your finger to your hand or from your toe to your foot.  You have worsening redness that spreads from your finger to your hand or from your toe up to your foot.  Your finger or toe looks blue or black.   This information is not intended to replace advice given to you by your health care provider. Make sure you discuss any questions you have with your health care provider.   Document Released: 01/31/2014 Document Reviewed: 01/31/2014 Elsevier Interactive Patient Education 2016 Elsevier Inc.  

## 2015-01-08 NOTE — ED Provider Notes (Signed)
TIME SEEN: 6:45 AM   CHIEF COMPLAINT: Wound recheck  HPI: Pt is a 24 y.o. male with no significant past history who presents to the emergency department with left great toenail that was ingrown that was partially removed on December 10 by Harolyn Rutherford, PA-C. Patient was discharged on Keflex. Presents here for wound recheck. States he was supposed to present 48 hours after procedure but could not get here. He has not had any complaints. Reports no pain in this toe. No drainage. No fever. Not a diabetic. Still on antibiotics.  ROS: See HPI Constitutional: no fever  Eyes: no drainage  ENT: no runny nose   Cardiovascular:  no chest pain  Resp: no SOB  GI: no vomiting GU: no dysuria Integumentary: no rash  Allergy: no hives  Musculoskeletal: no leg swelling  Neurological: no slurred speech ROS otherwise negative  PAST MEDICAL HISTORY/PAST SURGICAL HISTORY:  Past Medical History  Diagnosis Date  . Bronchitis   . Anxiety   . Mental disorder     ADHD    MEDICATIONS:  Prior to Admission medications   Medication Sig Start Date End Date Taking? Authorizing Provider  albuterol (PROVENTIL HFA;VENTOLIN HFA) 108 (90 BASE) MCG/ACT inhaler Inhale 2 puffs into the lungs every 6 (six) hours as needed for wheezing or shortness of breath. 05/20/14   Rodolph Bong, MD  cephALEXin (KEFLEX) 500 MG capsule Take 1 capsule (500 mg total) by mouth 4 (four) times daily. 01/03/15   Shawn C Joy, PA-C  cetirizine (ZYRTEC) 10 MG tablet Take 1 tablet (10 mg total) by mouth daily. One tab daily for allergies 09/12/14   Linna Hoff, MD  ibuprofen (ADVIL,MOTRIN) 800 MG tablet Take 1 tablet (800 mg total) by mouth 3 (three) times daily. 01/03/15   Shawn C Joy, PA-C  oxyCODONE-acetaminophen (PERCOCET/ROXICET) 5-325 MG tablet Take 1-2 tablets by mouth every 4 (four) hours as needed for severe pain. 01/03/15   Shawn C Joy, PA-C  predniSONE (DELTASONE) 10 MG tablet Take 3 tablets (30 mg total) by mouth daily. Patient not  taking: Reported on 09/12/2014 05/20/14   Rodolph Bong, MD  Zinc 10 MG LOZG Use as directed in the mouth or throat.    Historical Provider, MD    ALLERGIES:  Allergies  Allergen Reactions  . Peanut-Containing Drug Products Anaphylaxis, Itching and Nausea And Vomiting    SOCIAL HISTORY:  Social History  Substance Use Topics  . Smoking status: Never Smoker   . Smokeless tobacco: Never Used  . Alcohol Use: Yes     Comment: RARE    FAMILY HISTORY: History reviewed. No pertinent family history.  EXAM: BP 120/79 mmHg  Pulse 88  Temp(Src) 98 F (36.7 C)  Resp 16  Ht  (1.803 m)  Wt 140 lb (63.504 kg)  BMI 19.53 kg/m2  SpO2 100% CONSTITUTIONAL: Alert and oriented and responds appropriately to questions. Well-appearing; well-nourished HEAD: Normocephalic EYES: Conjunctivae clear, PERRL ENT: normal nose; no rhinorrhea; moist mucous membranes; pharynx without lesions noted NECK: Supple, no meningismus, no LAD  CARD: RRR; S1 and S2 appreciated; no murmurs, no clicks, no rubs, no gallops RESP: Normal chest excursion without splinting or tachypnea; breath sounds clear and equal bilaterally; no wheezes, no rhonchi, no rales, no hypoxia or respiratory distress, speaking full sentences ABD/GI: Normal bowel sounds; non-distended; soft, non-tender, no rebound, no guarding, no peritoneal signs BACK:  The back appears normal and is non-tender to palpation, there is no CVA tenderness EXT: Normal ROM in  all joints; non-tender to palpation; no edema; normal capillary refill; no cyanosis, no calf tenderness or swelling; 2+ DP pulse on the left with normal sensation, no joint effusions   SKIN: Normal color for age and race; warm, healing wound to the left great toe to the lateral aspect with no drainage, bleeding, erythema or warmth, induration or fluctuance NEURO: Moves all extremities equally, sensation to light touch intact diffusely, cranial nerves II through XII intact PSYCH: The patient's  mood and manner are appropriate. Grooming and personal hygiene are appropriate.  MEDICAL DECISION MAKING: Patient here for wound recheck. No sign of abscess, cellulitis. Neurovascular intact distally. He has no complaints at this time. We'll have him continue his antibiotics as prescribed. Discussed return precautions. I feel he is safe to be discharged. No life-threatening emergency present.       Layla MawKristen N Reza Crymes, DO 01/08/15 [phone]

## 2015-01-08 NOTE — ED Notes (Signed)
Pt had a nail remove on his great toe left foot that needs to be recheck, wound is clean and dry, pt denies any pain or discomfort.

## 2015-03-16 ENCOUNTER — Emergency Department (HOSPITAL_COMMUNITY)
Admission: EM | Admit: 2015-03-16 | Discharge: 2015-03-16 | Disposition: A | Discharge status: Home / Self Care | Payer: Managed Care, Other (non HMO) | Attending: Emergency Medicine | Admitting: Emergency Medicine

## 2015-03-16 ENCOUNTER — Encounter (HOSPITAL_COMMUNITY): Payer: Self-pay | Admitting: Emergency Medicine

## 2015-03-16 DIAGNOSIS — L600 Ingrowing nail: Secondary | ICD-10-CM | POA: Diagnosis not present

## 2015-03-16 NOTE — ED Notes (Signed)
Pt. reports pain with mild swelling at left great toe ingrown toenail onset December last year , denies drainage or fever.

## 2015-03-16 NOTE — ED Notes (Signed)
Pt. Left AMA  

## 2016-10-06 IMAGING — DX DG FOOT COMPLETE 3+V*L*
3 series · 3 of 3 positions shown · non-contrast
Comparison: Radiographs 06/16/2011

CLINICAL DATA: Ingrown toenail on great toe with surrounding
infection.

EXAM:
LEFT FOOT - COMPLETE 3+ VIEW

[foot ap]
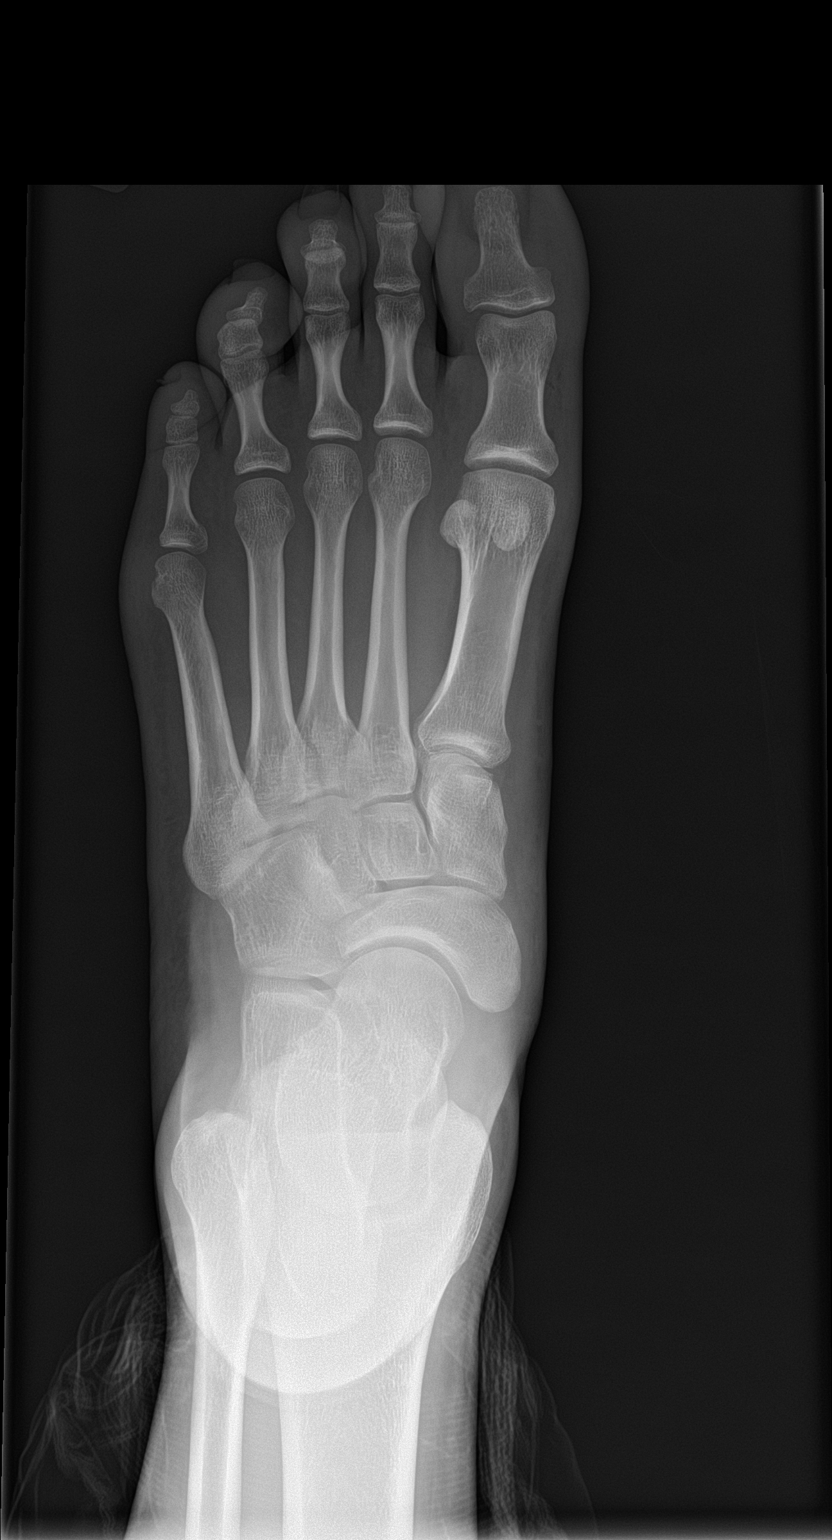

[foot obl]
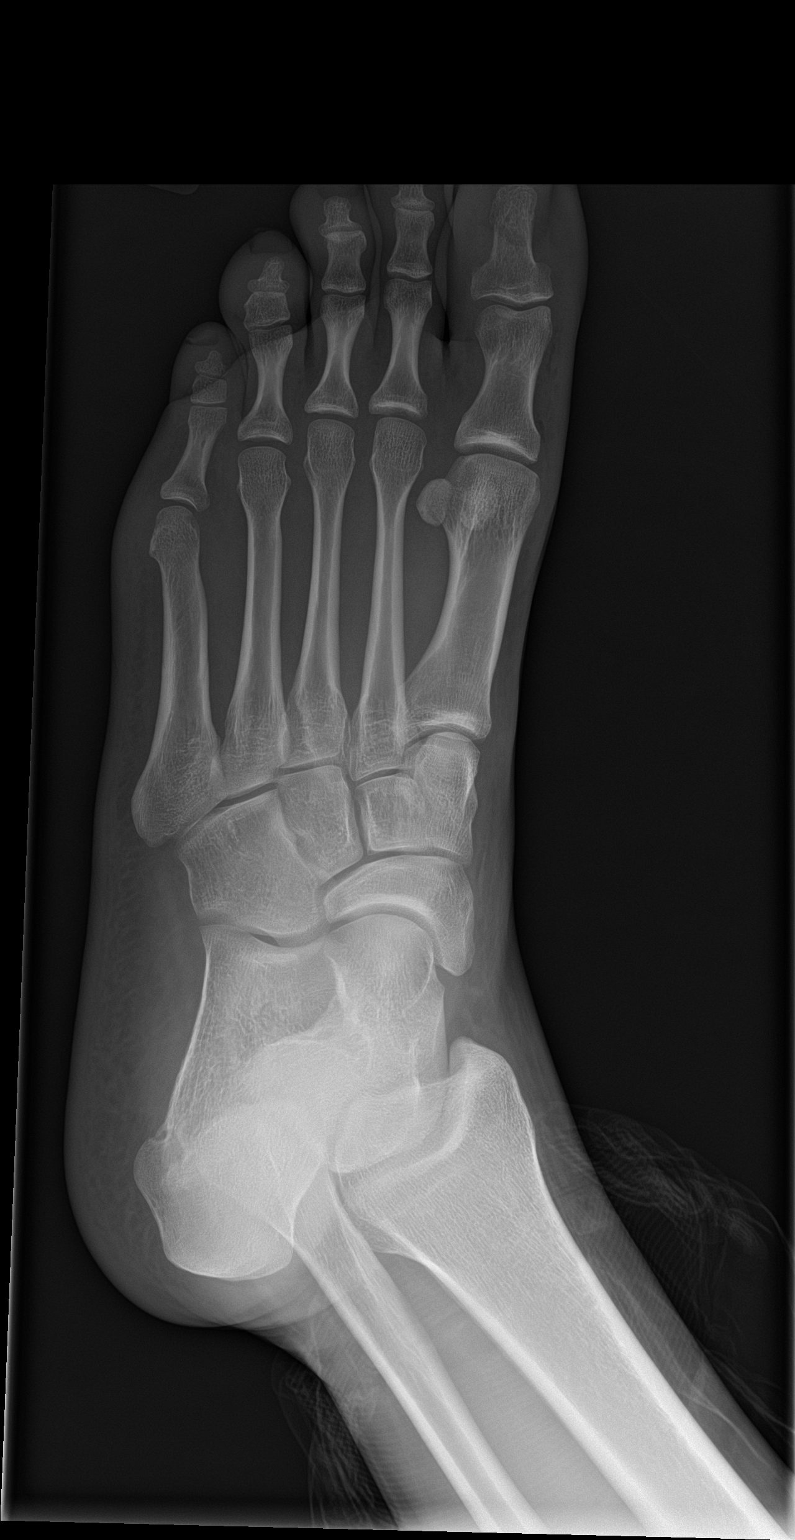

[foot lat]
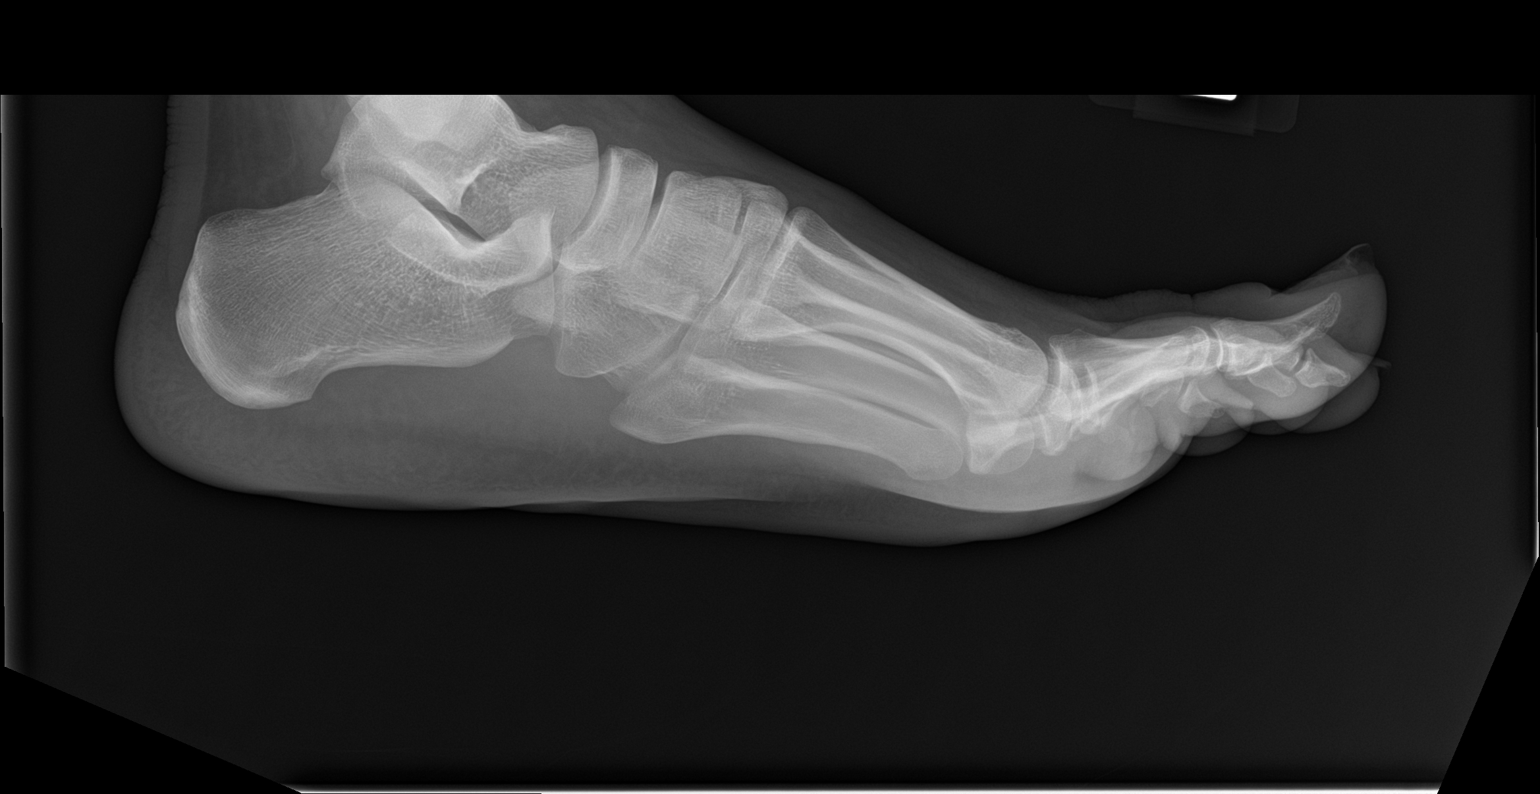

[3 of 3 positions shown; findings below may reference images not displayed]

FINDINGS: Mild soft tissue edema about the great toe. No soft tissue air,
radiopaque foreign body, or S subjacent osseous abnormality. No
fracture, dislocation, or bony destructive change. Previous lucency
involving the plantar soft tissues has resolved.
IMPRESSION: Soft tissue edema about the great toe.  No osseous abnormality.

## 2017-01-13 ENCOUNTER — Encounter (HOSPITAL_COMMUNITY): Payer: Self-pay | Admitting: Emergency Medicine

## 2017-01-13 ENCOUNTER — Emergency Department (HOSPITAL_COMMUNITY)
Admission: EM | Admit: 2017-01-13 | Discharge: 2017-01-13 | Disposition: A | Discharge status: Home / Self Care | Payer: Managed Care, Other (non HMO) | Attending: Emergency Medicine | Admitting: Emergency Medicine

## 2017-01-13 DIAGNOSIS — Z113 Encounter for screening for infections with a predominantly sexual mode of transmission: Secondary | ICD-10-CM | POA: Diagnosis not present

## 2017-01-13 DIAGNOSIS — Z91010 Allergy to peanuts: Secondary | ICD-10-CM | POA: Insufficient documentation

## 2017-01-13 DIAGNOSIS — Z79899 Other long term (current) drug therapy: Secondary | ICD-10-CM | POA: Diagnosis not present

## 2017-01-13 DIAGNOSIS — N4889 Other specified disorders of penis: Secondary | ICD-10-CM | POA: Diagnosis not present

## 2017-01-13 LAB — RPR: RPR Ser Ql: NONREACTIVE

## 2017-01-13 LAB — HIV ANTIBODY (ROUTINE TESTING W REFLEX): HIV SCREEN 4TH GENERATION: NONREACTIVE

## 2017-01-13 NOTE — Discharge Instructions (Signed)
You were not tested for all STDs today. Your gonorrhea and chlamydia tests are pending- if they are positive, you will receive a phone call. Refrain from sex until you have the results from a full STD screen. Please go to [facility] (Address: [address], [place], [place] [address] Phone: [phone]) or see the [facility] (Address: [address] Phone: [phone]) for full STD screening. Return to the emergency room for worsening of symptoms, fever, and vomiting.  Do not hesitate to return to the emergency room for any new, worsening or concerning symptoms.  Please obtain primary care using resource guide below. Let them know that you were seen in the emergency room and that they will need to obtain records for further outpatient management.

## 2017-01-13 NOTE — ED Triage Notes (Signed)
Pt reports he his penis has been itching X1 month ever since having oral sex. Denies rash, pain, burning, discharge, etc. States he used a condom during intercourse.

## 2017-01-13 NOTE — ED Provider Notes (Signed)
[facility] Provider Note   CSN: [id number] Arrival date & time: 01/13/17  [id number]     History   Chief Complaint Chief Complaint  Patient presents with  . SEXUALLY TRANSMITTED DISEASE    HPI   Blood pressure 119/81, pulse 70, temperature 97.6 F (36.4 C), temperature source Oral, resp. rate 18, height 5\' 11"  (1.803 m), weight 63.5 kg (140 lb), SpO2 100 %.  Dale Hayden is a 26 y.o. male complaining of penile itching onset 1 month ago after receiving sex.  He states that significantly worsened recently.  He had intercourse or using a condom but did not use a condom for the oral sex.  He denies any urethral discharge, dysuria, hematuria, rash, lesion, abdominal pain, testicular pain or swelling.   Past Medical History:  Diagnosis Date  . Anxiety   . Bronchitis   . Mental disorder    ADHD    There are no active problems to display for this patient.   Past Surgical History:  Procedure Laterality Date  . I&D EXTREMITY  06/16/2011   Procedure: IRRIGATION AND DEBRIDEMENT EXTREMITY;  Surgeon: Verlee RossettiSteven R Norris, MD;  [facility];  Service: Orthopedics;  Laterality: Left;  I&D left foot with removal of foreign body  . NO PAST SURGERIES         Home Medications    Prior to Admission medications   Medication Sig Start Date End Date Taking? Authorizing Provider  albuterol (PROVENTIL HFA;VENTOLIN HFA) 108 (90 BASE) MCG/ACT inhaler Inhale 2 puffs into the lungs every 6 (six) hours as needed for wheezing or shortness of breath. 05/20/14   Rodolph Bongorey, Evan S, MD  cephALEXin (KEFLEX) 500 MG capsule Take 1 capsule (500 mg total) by mouth 4 (four) times daily. 01/03/15   Joy, Shawn C, PA-C  cetirizine (ZYRTEC) 10 MG tablet Take 1 tablet (10 mg total) by mouth daily. One tab daily for allergies Patient not taking: Reported on 01/08/2015 09/12/14   Linna HoffKindl, Mcatee D, MD  ibuprofen (ADVIL,MOTRIN) 800 MG tablet Take 1 tablet (800 mg total) by mouth 3 (three)  times daily. 01/03/15   Joy, Shawn C, PA-C  oxyCODONE-acetaminophen (PERCOCET/ROXICET) 5-325 MG tablet Take 1-2 tablets by mouth every 4 (four) hours as needed for severe pain. 01/03/15   Joy, Shawn C, PA-C  predniSONE (DELTASONE) 10 MG tablet Take 3 tablets (30 mg total) by mouth daily. Patient not taking: Reported on 09/12/2014 05/20/14   Rodolph Bongorey, Evan S, MD    Family History No family history on file.  Social History Social History   Tobacco Use  . Smoking status: Never Smoker  . Smokeless tobacco: Never Used  Substance Use Topics  . Alcohol use: Yes    Comment: RARE  . Drug use: No     Allergies   Peanut-containing drug products and Shellfish allergy   Review of Systems Review of Systems  A complete review of systems was obtained and all systems are negative except as noted in the HPI and PMH.   Physical Exam Updated Vital Signs BP 119/81 (BP Location: Right Arm)   Pulse 70   Temp 97.6 F (36.4 C) (Oral)   Resp 18   Ht 5\' 11"  (1.803 m)   Wt 63.5 kg (140 lb)   SpO2 100%   BMI 19.53 kg/m   Physical Exam  Constitutional: He is oriented to person, place, and time. He appears well-developed and well-nourished. No distress.  HENT:  Head: Normocephalic and atraumatic.  Mouth/Throat:  Oropharynx is clear and moist.  Eyes: Conjunctivae and EOM are normal. Pupils are equal, round, and reactive to light.  Neck: Normal range of motion.  Cardiovascular: Normal rate, regular rhythm and intact distal pulses.  Pulmonary/Chest: Effort normal and breath sounds normal.  Abdominal: Soft. There is no tenderness.  Genitourinary:  Genitourinary Comments: Chaperoned by Technician: No rashes or lesions, patient is circumcised, no testicular pain or swelling.  Musculoskeletal: Normal range of motion.  Neurological: He is alert and oriented to person, place, and time.  Skin: He is not diaphoretic.  Psychiatric: He has a normal mood and affect.  Nursing note and vitals  reviewed.    ED Treatments / Results  Labs (all labs ordered are listed, but only abnormal results are displayed) Labs Reviewed  RPR  HIV ANTIBODY (ROUTINE TESTING)  GC/CHLAMYDIA PROBE AMP ([place]) NOT [facility]    EKG  EKG Interpretation None       Radiology No results found.  Procedures Procedures (including critical care time)  Medications Ordered in ED Medications - No data to display   Initial Impression / Assessment and Plan / ED Course  I have reviewed the triage vital signs and the nursing notes.  Pertinent labs & imaging results that were available during my care of the patient were reviewed by me and considered in my medical decision making (see chart for details).     Vitals:   01/13/17 0659  BP: 119/81  Pulse: 70  Resp: 18  Temp: 97.6 F (36.4 C)  TempSrc: Oral  SpO2: 100%  Weight: 63.5 kg (140 lb)  Height: 5\' 11"  (1.803 m)    Dale Hayden is 26 y.o. male presenting with penile itching, no rash. PE reassuring.  STD testing pending.  Evaluation does not show pathology that would require ongoing emergent intervention or inpatient treatment. Pt is hemodynamically stable and mentating appropriately. Discussed findings and plan with patient/guardian, who agrees with care plan. All questions answered. Return precautions discussed and outpatient follow up given.      Final Clinical Impressions(s) / ED Diagnoses   Final diagnoses:  Penile irritation    ED Discharge Orders    None       Lynetta Mareisciotta, Mardella Laymanicole, PA-C 01/13/17 0801    Jacalyn LefevreHaviland, Julie, MD 01/13/17 [phone]

## 2017-01-16 LAB — GC/CHLAMYDIA PROBE AMP (~~LOC~~) NOT AT ARMC
CHLAMYDIA, DNA PROBE: NEGATIVE
NEISSERIA GONORRHEA: NEGATIVE

## 2017-06-29 ENCOUNTER — Other Ambulatory Visit: Payer: Self-pay

## 2017-06-29 ENCOUNTER — Encounter (HOSPITAL_COMMUNITY): Payer: Self-pay | Admitting: Emergency Medicine

## 2017-06-29 ENCOUNTER — Emergency Department (HOSPITAL_COMMUNITY)
Admission: EM | Admit: 2017-06-29 | Discharge: 2017-06-29 | Disposition: A | Discharge status: Home / Self Care | Payer: Managed Care, Other (non HMO) | Attending: Emergency Medicine | Admitting: Emergency Medicine

## 2017-06-29 DIAGNOSIS — Z5321 Procedure and treatment not carried out due to patient leaving prior to being seen by health care provider: Secondary | ICD-10-CM | POA: Diagnosis not present

## 2017-06-29 DIAGNOSIS — Z202 Contact with and (suspected) exposure to infections with a predominantly sexual mode of transmission: Secondary | ICD-10-CM | POA: Insufficient documentation

## 2017-06-29 NOTE — ED Notes (Signed)
Patient back up to desk.  States he needs to leave and go to work.  Return precautions reviewed, and encouraged patient to stay, states he has no choice.  Will d/c.

## 2017-06-29 NOTE — ED Notes (Signed)
Patient up to desk, asking about wait times

## 2017-06-29 NOTE — ED Triage Notes (Signed)
Pts girlfriend tested positive trichinosis and gonorrhea and pt wants to be tested. No reported symptoms.

## 2017-06-30 ENCOUNTER — Emergency Department (HOSPITAL_COMMUNITY)
Admission: EM | Admit: 2017-06-30 | Discharge: 2017-06-30 | Disposition: A | Discharge status: Home / Self Care | Payer: Managed Care, Other (non HMO) | Attending: Emergency Medicine | Admitting: Emergency Medicine

## 2017-06-30 ENCOUNTER — Encounter (HOSPITAL_COMMUNITY): Payer: Self-pay | Admitting: *Deleted

## 2017-06-30 ENCOUNTER — Other Ambulatory Visit: Payer: Self-pay

## 2017-06-30 DIAGNOSIS — Z202 Contact with and (suspected) exposure to infections with a predominantly sexual mode of transmission: Secondary | ICD-10-CM | POA: Insufficient documentation

## 2017-06-30 DIAGNOSIS — Z79899 Other long term (current) drug therapy: Secondary | ICD-10-CM | POA: Diagnosis not present

## 2017-06-30 LAB — RAPID HIV SCREEN (HIV 1/2 AB+AG)
HIV 1/2 Antibodies: NONREACTIVE
HIV-1 P24 Antigen - HIV24: NONREACTIVE

## 2017-06-30 MED ORDER — CEFTRIAXONE SODIUM 250 MG IJ SOLR
250.0000 mg | Freq: Once | INTRAMUSCULAR | Status: AC
  Administered 2017-06-30: 250 mg via INTRAMUSCULAR
  Filled 2017-06-30: qty 250

## 2017-06-30 MED ORDER — CIPROFLOXACIN HCL 500 MG PO TABS
1000.0000 mg | ORAL_TABLET | Freq: Once | ORAL | Status: AC
  Administered 2017-06-30: 1000 mg via ORAL
  Filled 2017-06-30: qty 2

## 2017-06-30 MED ORDER — METRONIDAZOLE 500 MG PO TABS
2000.0000 mg | ORAL_TABLET | Freq: Once | ORAL | Status: AC
  Administered 2017-06-30: 2000 mg via ORAL
  Filled 2017-06-30: qty 4

## 2017-06-30 NOTE — ED Triage Notes (Signed)
Pt's girlfriend tested positive for gonorrhea and trichomonas. Pt denies any symptoms, wants to be tested

## 2017-06-30 NOTE — ED Provider Notes (Signed)
[facility] Provider [facility]   CSN: [id number] Arrival date & time: 06/30/17  0608     History   Chief Complaint Chief Complaint  Patient presents with  . SEXUALLY TRANSMITTED DISEASE    HPI Dale Hayden is a 27 y.o. male.  HPI Patient presents to the emergency department with the treatment for gonorrhea and trichomonas.  Patient states his girlfriend tested positive for gonorrhea and trichomonas.  He states that he would also like to be tested for HIV.  The patient states that he does not have any symptoms at this time.  He denies any discharge or dysuria.  Patient states that he did not take any medications prior to arrival for symptoms.  The patient denies chest pain, shortness of breath, headache,blurred vision, neck pain, fever, cough, weakness, numbness, dizziness, anorexia, edema, abdominal pain, nausea, vomiting, diarrhea, rash, back pain, dysuria, hematemesis, bloody stool, near syncope, or syncope. Past Medical History:  Diagnosis Date  . Anxiety   . Bronchitis   . Mental disorder    ADHD    There are no active problems to display for this patient.   Past Surgical History:  Procedure Laterality Date  . I&D EXTREMITY  06/16/2011   Procedure: IRRIGATION AND DEBRIDEMENT EXTREMITY;  Surgeon: Verlee RossettiSteven R Norris, MD;  [facility];  Service: Orthopedics;  Laterality: Left;  I&D left foot with removal of foreign body  . NO PAST SURGERIES          Home Medications    Prior to Admission medications   Medication Sig Start Date End Date Taking? Authorizing Provider  albuterol (PROVENTIL HFA;VENTOLIN HFA) 108 (90 BASE) MCG/ACT inhaler Inhale 2 puffs into the lungs every 6 (six) hours as needed for wheezing or shortness of breath. 05/20/14  Yes Rodolph Bongorey, Evan S, MD  cetirizine (ZYRTEC) 10 MG tablet Take 1 tablet (10 mg total) by mouth daily. One tab daily for allergies Patient taking differently: Take 10 mg by mouth daily as needed for  allergies. One tab daily for allergies 09/12/14  Yes Kindl, Quita SkyeJames D, MD  Multiple Vitamins-Minerals (MULTIVITAMIN WITH MINERALS) tablet Take 1 tablet by mouth 2 (two) times daily.   Yes [provider]  ibuprofen (ADVIL,MOTRIN) 800 MG tablet Take 1 tablet (800 mg total) by mouth 3 (three) times daily. Patient not taking: Reported on 06/30/2017 01/03/15   Anselm PancoastJoy, Shawn C, PA-C  oxyCODONE-acetaminophen (PERCOCET/ROXICET) 5-325 MG tablet Take 1-2 tablets by mouth every 4 (four) hours as needed for severe pain. Patient not taking: Reported on 06/30/2017 01/03/15   Anselm PancoastJoy, Shawn C, PA-C    Family History No family history on file.  Social History Social History   Tobacco Use  . Smoking status: Never Smoker  . Smokeless tobacco: Never Used  Substance Use Topics  . Alcohol use: Yes    Comment: RARE  . Drug use: No     Allergies   Peanut-containing drug products and Shellfish allergy   Review of Systems Review of Systems All other systems negative except as documented in the HPI. All pertinent positives and negatives as reviewed in the HPI.  Physical Exam Updated Vital Signs BP 124/85 (BP Location: Right Arm)   Pulse 68   Temp 97.8 F (36.6 C) (Oral)   Resp 16   SpO2 100%   Physical Exam  Constitutional: He is oriented to person, place, and time. He appears well-developed and well-nourished. No distress.  HENT:  Head: Normocephalic and atraumatic.  Eyes: Pupils are  equal, round, and reactive to light.  Pulmonary/Chest: Effort normal.  Genitourinary: Penis normal.  Neurological: He is alert and oriented to person, place, and time.  Skin: Skin is warm and dry.  Psychiatric: He has a normal mood and affect.  Nursing note and vitals reviewed.    ED Treatments / Results  Labs (all labs ordered are listed, but only abnormal results are displayed) Labs Reviewed  RAPID HIV SCREEN (HIV 1/2 AB+AG)  RPR    EKG None  Radiology No results found.  Procedures Procedures  (including critical care time)  Medications Ordered in ED Medications  metroNIDAZOLE (FLAGYL) tablet 2,000 mg (2,000 mg Oral Given 06/30/17 0658)  ciprofloxacin (CIPRO) tablet 1,000 mg (1,000 mg Oral Given 06/30/17 0658)  cefTRIAXone (ROCEPHIN) injection 250 mg (250 mg Intramuscular Given 06/30/17 0659)     Initial Impression / Assessment and Plan / ED Course  I have reviewed the triage vital signs and the nursing notes.  Pertinent labs & imaging results that were available during my care of the patient were reviewed by me and considered in my medical decision making (see chart for details).     Patient will be treated for STDs.  Have advised him to follow-up with the health department.  Told to return here as needed.  Final Clinical Impressions(s) / ED Diagnoses   Final diagnoses:  STD exposure    ED Discharge Orders    None       Charlestine Night, PA-C 06/30/17 1545    Azalia Bilis, MD 06/30/17 1630

## 2017-06-30 NOTE — Discharge Instructions (Addendum)
Return here as needed.  Follow-up with the health department as needed.  Your testing today so far was negative.  You will need to be retested in 6 months

## 2017-07-02 LAB — RPR: RPR Ser Ql: NONREACTIVE

## 2018-01-11 ENCOUNTER — Emergency Department (HOSPITAL_COMMUNITY)
Admission: EM | Admit: 2018-01-11 | Discharge: 2018-01-11 | Disposition: A | Discharge status: Home / Self Care | Payer: Managed Care, Other (non HMO) | Attending: Emergency Medicine | Admitting: Emergency Medicine

## 2018-01-11 ENCOUNTER — Encounter (HOSPITAL_COMMUNITY): Payer: Self-pay | Admitting: Emergency Medicine

## 2018-01-11 ENCOUNTER — Other Ambulatory Visit: Payer: Self-pay

## 2018-01-11 DIAGNOSIS — R5383 Other fatigue: Secondary | ICD-10-CM | POA: Insufficient documentation

## 2018-01-11 DIAGNOSIS — Z79899 Other long term (current) drug therapy: Secondary | ICD-10-CM | POA: Insufficient documentation

## 2018-01-11 DIAGNOSIS — R0981 Nasal congestion: Secondary | ICD-10-CM | POA: Insufficient documentation

## 2018-01-11 LAB — I-STAT CHEM 8, ED
BUN: 9 mg/dL (ref 6–20)
Calcium, Ion: 1.06 mmol/L — ABNORMAL LOW (ref 1.15–1.40)
Chloride: 101 mmol/L (ref 98–111)
Creatinine, Ser: 0.8 mg/dL (ref 0.61–1.24)
Glucose, Bld: 91 mg/dL (ref 70–99)
HCT: 47 % (ref 39.0–52.0)
Hemoglobin: 16 g/dL (ref 13.0–17.0)
Potassium: 3.6 mmol/L (ref 3.5–5.1)
Sodium: 137 mmol/L (ref 135–145)
TCO2: 28 mmol/L (ref 22–32)

## 2018-01-11 LAB — TSH: TSH: 0.934 u[IU]/mL (ref 0.350–4.500)

## 2018-01-11 NOTE — Discharge Instructions (Signed)
Follow-up with your primary care doctor next week if the symptoms persist.  Make sure to eat regularly.  Get plenty of rest

## 2018-01-11 NOTE — ED Triage Notes (Signed)
Pt reports feeling really tired for the last few days. Recently with post nasal drip and mild fever. Those symptoms have resolved but still feels tired.

## 2018-01-11 NOTE — ED Provider Notes (Signed)
[facility] Provider Note   CSN: [id number] Arrival date & time: 01/11/18  1104     History   Chief Complaint Chief Complaint  Patient presents with  . Fatigue    HPI Dale RichardsKierre L Ceja is a 27 y.o. male.  HPI Pt presents to the ED for evaluation of fatigue.  Patient states he has felt this way for the last few days.  Is getting very tired.  He feels like he is going to fall asleep at the wheel.  He has been working full days but has been getting good night sleep.  He said he has had subjective fevers the last couple of days but has not measured temperature.  He has had some nasal congestion but no cough.  No shortness of breath.  No chest pain.  No abdominal pain.  No vomiting or diarrhea.  No recent medication changes. Past Medical History:  Diagnosis Date  . Anxiety   . Bronchitis   . Mental disorder    ADHD    There are no active problems to display for this patient.   Past Surgical History:  Procedure Laterality Date  . I&D EXTREMITY  06/16/2011   Procedure: IRRIGATION AND DEBRIDEMENT EXTREMITY;  Surgeon: Verlee RossettiSteven R Norris, MD;  [facility];  Service: Orthopedics;  Laterality: Left;  I&D left foot with removal of foreign body  . NO PAST SURGERIES          Home Medications    Prior to Admission medications   Medication Sig Start Date End Date Taking? Authorizing Provider  albuterol (PROVENTIL HFA;VENTOLIN HFA) 108 (90 BASE) MCG/ACT inhaler Inhale 2 puffs into the lungs every 6 (six) hours as needed for wheezing or shortness of breath. 05/20/14   Rodolph Bongorey, Evan S, MD  cetirizine (ZYRTEC) 10 MG tablet Take 1 tablet (10 mg total) by mouth daily. One tab daily for allergies Patient taking differently: Take 10 mg by mouth daily as needed for allergies. One tab daily for allergies 09/12/14   Linna HoffKindl, Viets D, MD  ibuprofen (ADVIL,MOTRIN) 800 MG tablet Take 1 tablet (800 mg total) by mouth 3 (three) times daily. Patient not taking: Reported  on 06/30/2017 01/03/15   Anselm PancoastJoy, Shawn C, PA-C  Multiple Vitamins-Minerals (MULTIVITAMIN WITH MINERALS) tablet Take 1 tablet by mouth 2 (two) times daily.    [provider]  oxyCODONE-acetaminophen (PERCOCET/ROXICET) 5-325 MG tablet Take 1-2 tablets by mouth every 4 (four) hours as needed for severe pain. Patient not taking: Reported on 06/30/2017 01/03/15   Anselm PancoastJoy, Shawn C, PA-C    Family History No family history on file.  Social History Social History   Tobacco Use  . Smoking status: Never Smoker  . Smokeless tobacco: Never Used  Substance Use Topics  . Alcohol use: Yes    Comment: RARE  . Drug use: No     Allergies   Peanut-containing drug products and Shellfish allergy   Review of Systems Review of Systems  All other systems reviewed and are negative.    Physical Exam Updated Vital Signs BP 115/75 (BP Location: Right Arm)   Pulse 80   Temp 98.4 F (36.9 C) (Oral)   Resp 12   SpO2 100%   Physical Exam Vitals signs and nursing note reviewed.  Constitutional:      General: He is not in acute distress.    Appearance: He is well-developed.  HENT:     Head: Normocephalic and atraumatic.     Right Ear: External  ear normal.     Left Ear: External ear normal.  Eyes:     General: No scleral icterus.       Right eye: No discharge.        Left eye: No discharge.     Conjunctiva/sclera: Conjunctivae normal.  Neck:     Musculoskeletal: Neck supple.     Trachea: No tracheal deviation.  Cardiovascular:     Rate and Rhythm: Normal rate and regular rhythm.  Pulmonary:     Effort: Pulmonary effort is normal. No respiratory distress.     Breath sounds: Normal breath sounds. No stridor. No wheezing or rales.  Abdominal:     General: Bowel sounds are normal. There is no distension.     Palpations: Abdomen is soft.     Tenderness: There is no abdominal tenderness. There is no guarding or rebound.  Musculoskeletal:        General: No tenderness.  Skin:    General:  Skin is warm and dry.     Findings: No rash.  Neurological:     Mental Status: He is alert.     Cranial Nerves: No cranial nerve deficit (no facial droop, extraocular movements intact, no slurred speech).     Sensory: No sensory deficit.     Motor: No abnormal muscle tone or seizure activity.     Coordination: Coordination normal.      ED Treatments / Results  Labs (all labs ordered are listed, but only abnormal results are displayed) Labs Reviewed  I-STAT CHEM 8, ED - Abnormal; Notable for the following components:      Result Value   Calcium, Ion 1.06 (*)    All other components within normal limits  TSH     Procedures Procedures (including critical care time)  Medications Ordered in ED Medications - No data to display   Initial Impression / Assessment and Plan / ED Course  I have reviewed the triage vital signs and the nursing notes.  Pertinent labs & imaging results that were available during my care of the patient were reviewed by me and considered in my medical decision making (see chart for details).    Patient presents with generalized fatigue.  He denies any other complaints.  He is not having any pain.  No signs of infection.  Laboratory tests are normal.  No signs to suggest hypothyroidism.  No anemia, hyperglycemia or electrolyte abnormalities.  At this time there does not appear to be any evidence of an acute emergency medical condition and the patient appears stable for discharge with appropriate outpatient follow up.   Final Clinical Impressions(s) / ED Diagnoses   Final diagnoses:  Fatigue, unspecified type    ED Discharge Orders    None       Linwood DibblesKnapp, Jakeira Seeman, MD 01/11/18 1254

## 2019-10-04 ENCOUNTER — Ambulatory Visit (INDEPENDENT_AMBULATORY_CARE_PROVIDER_SITE_OTHER): Payer: Managed Care, Other (non HMO) | Admitting: Medical

## 2019-10-04 ENCOUNTER — Encounter: Payer: Self-pay | Admitting: Medical

## 2019-10-04 VITALS — BP 118/84 | HR 72 | Ht 69.0 in | Wt 151.0 lb

## 2019-10-04 DIAGNOSIS — L509 Urticaria, unspecified: Secondary | ICD-10-CM | POA: Diagnosis not present

## 2019-10-04 DIAGNOSIS — T7840XA Allergy, unspecified, initial encounter: Secondary | ICD-10-CM | POA: Diagnosis not present

## 2019-10-04 DIAGNOSIS — T783XXA Angioneurotic edema, initial encounter: Secondary | ICD-10-CM | POA: Diagnosis not present

## 2019-10-04 MED ORDER — DIPHENHYDRAMINE HCL 50 MG PO TABS: 50.0000 mg | ORAL_TABLET | Freq: Every evening | ORAL | 0 refills | Status: AC | PRN

## 2019-10-04 MED ORDER — EPINEPHRINE 0.3 MG/0.3ML IJ SOAJ: 0.3000 mg | INTRAMUSCULAR | 1 refills | Status: AC | PRN

## 2019-10-04 NOTE — Patient Instructions (Signed)
Epinephrine injection (Auto-injector) What is this medicine? EPINEPHRINE (ep i NEF rin) is used for the emergency treatment of severe allergic reactions. You should keep this medicine with you at all times. This medicine may be used for other purposes; ask your health care provider or pharmacist if you have questions. COMMON BRAND NAME(S): Adrenaclick, Auvi-Q, Epinephrine Professional EMS, epinephrinesnap, epinephrinesnap-v, EpiPen, EPIsnap Epinephrine, SYMJEPI, Twinject What should I tell my health care provider before I take this medicine? They need to know if you have any of the following conditions:  diabetes  heart disease  high blood pressure  lung or breathing disease, like asthma  Parkinson's disease  thyroid disease  an unusual or allergic reaction to epinephrine, sulfites, other medicines, foods, dyes, or preservatives  pregnant or trying to get pregnant  breast-feeding How should I use this medicine? This medicine is for injection into the outer thigh. Your doctor or health care professional will instruct you on the proper use of the device during an emergency. Read all directions carefully and make sure you understand them. Do not use more often than directed. Talk to your pediatrician regarding the use of this medicine in children. Special care may be needed. This drug is commonly used in children. A special device is available for use in children. If you are giving this medicine to a young child, hold their leg firmly in place before and during the injection to prevent injury. Overdosage: If you think you have taken too much of this medicine contact a poison control center or emergency room at once. NOTE: This medicine is only for you. Do not share this medicine with others. What if I miss a dose? This does not apply. You should only use this medicine for an allergic reaction. What may interact with this medicine? This medicine is only used during an emergency.  Significant drug interactions are not likely during emergency use. This list may not describe all possible interactions. Give your health care provider a list of all the medicines, herbs, non-prescription drugs, or dietary supplements you use. Also tell them if you smoke, drink alcohol, or use illegal drugs. Some items may interact with your medicine. What should I watch for while using this medicine? Keep this medicine ready for use in the case of a severe allergic reaction. Make sure that you have the phone number of your doctor or health care professional and local hospital ready. Remember to check the expiration date of your medicine regularly. You may need to have additional units of this medicine with you at work, school, or other places. Talk to your doctor or health care professional about your need for extra units. Some emergencies may require an additional dose. Check with your doctor or a health care professional before using an extra dose. After use, go to the nearest hospital or call 911. Avoid physical activity. Make sure the treating health care professional knows you have received an injection of this medicine. You will receive additional instructions on what to do during and after use of this medicine before a medical emergency occurs. What side effects may I notice from receiving this medicine? Side effects that you should report to your doctor or health care professional as soon as possible:  allergic reactions like skin rash, itching or hives, swelling of the face, lips, or tongue  breathing problems  chest pain  fast, irregular heartbeat  pain, tingling, numbness in the hands or feet  pain, redness, or irritation at site where injected  vomiting Side   effects that usually do not require medical attention (report to your doctor or health care professional if they continue or are bothersome):  anxious  dizziness  dry mouth  headache  increased  sweating  nausea  unusually weak or tired This list may not describe all possible side effects. Call your doctor for medical advice about side effects. You may report side effects to FDA at 1-800-FDA-1088. Where should I keep my medicine? Keep out of the reach of children. Store at room temperature between 15 and 30 degrees C (59 and 86 degrees F). Protect from light and heat. The solution should be clear in color. If the solution is discolored or contains particles it must be replaced. Throw away any unused medicine after the expiration date. Ask your doctor or pharmacist about proper disposal of the injector if it is expired or has been used. Always replace your auto-injector before it expires. NOTE: This sheet is a summary. It may not cover all possible information. If you have questions about this medicine, talk to your doctor, pharmacist, or health care provider.  2020 Elsevier/Gold Standard (2014-06-16 12:24:50)  

## 2019-10-04 NOTE — Progress Notes (Signed)
Subjective:  Dale Hayden is a 29 y.o. male who presents for Chief Complaint  Patient presents with  . New Patient (Initial Visit)    want referral for alergy testing      Here as a new patient for concern of allergies.  He notes having concerns about several food allergies.  Sometimes in the past he has gotten swelling and hives inside the mouth and swelling of the tongue and lips and face eating nuts and strep.  However other times when eating Snickers bars really candy bars with nuts in it he does not have a problem.  Similarly, he can tolerate some fish, but reacts to tilapia.  Sometimes he can eat a few blueberries and not have any issue and sometimes he gets the same symptoms with blueberries and pears or the skin of fruits and vegetables.  So he is not sure if he is actually allergic to the items are not or the skin of the fruit or some chemical on the produce.  He does washes fruits and vegetables.  Eating carrots and steroids have at times giving him problems.  He has never had allergy testing.  When he gets swelling and hives he will take some Benadryl over-the-counter.  He has never had EpiPen or emergency department visit for allergic reaction.  Otherwise has been in usual state of health without complaint.  He does not smoke.  No significant family history of allergies other than mom has had some allergies he thinks may be a shellfish allergy.  No other aggravating or relieving factors.    No other c/o.  The following portions of the patient's history were reviewed and updated as appropriate: allergies, current medications, past family history, past medical history, past social history, past surgical history and problem list.  ROS Otherwise as in subjective above    Objective: BP 118/84   Pulse 72   Ht 5\' 9"  (1.753 m)   Wt 151 lb (68.5 kg)   SpO2 98%   BMI 22.30 kg/m   General appearance: alert, no distress, well developed, well nourished, lean African American  male HEENT: normocephalic, sclerae anicteric, conjunctiva pink and moist, TMs pearly, nares patent, no discharge or erythema, pharynx normal Oral cavity: MMM, no lesions Neck: supple, no lymphadenopathy, no thyromegaly, no masses Heart: RRR, normal S1, S2, no murmurs Lungs: CTA bilaterally, no wheezes, rhonchi, or rales Abdomen: +bs, soft, non tender, non distended, no masses, no hepatomegaly, no splenomegaly Pulses: 2+ radial pulses, 2+ pedal pulses, normal cap refill Ext: no edema Skin: no obvious rash    Assessment: Encounter Diagnoses  Name Primary?  . Allergic reaction, initial encounter Yes  . Hives   . Angioedema, initial encounter      Plan: We discussed his symptoms and possible causes.  Given the wide variety of foods that he is concerned about we will refer to allergist for formal testing.  I did prescribe an EpiPen and a higher dose Benadryl to have on hand for reaction.  We discussed the proper use of each medicine and when to use this.  We discussed when to use just Benadryl without the EpiPen if very mild reaction or not sure about the reaction  [name] was seen today for new patient (initial visit).  Diagnoses and all orders for this visit:  Allergic reaction, initial encounter -     Ambulatory referral to Allergy  Hives -     Ambulatory referral to Allergy  Angioedema, initial encounter -  Ambulatory referral to Allergy  Other orders -     EPINEPHrine (EPIPEN 2-PAK) 0.3 mg/0.3 mL IJ SOAJ injection; Inject 0.3 mg into the muscle as needed for anaphylaxis. -     diphenhydrAMINE (BENADRYL) 50 MG tablet; Take 1 tablet (50 mg total) by mouth at bedtime as needed for itching.    Follow up: pending referral

## 2019-10-30 ENCOUNTER — Ambulatory Visit: Payer: Managed Care, Other (non HMO) | Admitting: Allergy

## 2019-10-30 NOTE — Progress Notes (Deleted)
New Patient Note  RE: [name] MRN: [id number] DOB: 1990/03/18 Date of Office Visit: 10/30/2019  Referring provider: Jac Canavan, PA-C Primary care provider: Jac Canavan, PA-C  Chief Complaint: No chief complaint on file.  History of Present Illness: I had the pleasure of seeing Dale Hayden for initial evaluation at the [facility] on 10/30/2019. He is a 29 y.o. male, who is referred here by Jac Canavan, PA-C for the evaluation of food allergy.  He reports food allergy to ***. The reaction occurred at the age of ***, after he ate *** amount of ***. Symptoms started within *** and was in the form of *** hives, swelling, wheezing, abdominal pain, diarrhea, vomiting. ***Denies any associated cofactors such as exertion, infection, NSAID use, or alcohol consumption. The symptoms lasted for ***. He was evaluated in ED and received ***. Since this episode, he does *** not report other accidental exposures to ***. He does *** not have access to epinephrine autoinjector and *** needed to use it.   Past work up includes: immunocap which showed *** and skin prick testing which showed ***.  Dietary History: patient has been eating other foods including ***milk, ***eggs, ***peanut, ***treenuts, ***sesame, ***shellfish, ***fish, ***soy, ***wheat, ***meats, ***fruits and ***vegetables.  He reports reading labels and avoiding *** in diet completely. He tolerates ***baked egg and baked milk products.   10/04/2019 PCP visit: "Here as a new patient for concern of allergies.  He notes having concerns about several food allergies.  Sometimes in the past he has gotten swelling and hives inside the mouth and swelling of the tongue and lips and face eating nuts and strep.  However other times when eating Snickers bars really candy bars with nuts in it he does not have a problem.  Similarly, he can tolerate some fish, but reacts to tilapia.  Sometimes he can eat a few  blueberries and not have any issue and sometimes he gets the same symptoms with blueberries and pears or the skin of fruits and vegetables.  So he is not sure if he is actually allergic to the items are not or the skin of the fruit or some chemical on the produce.  He does washes fruits and vegetables.  Eating carrots and steroids have at times giving him problems.  He has never had allergy testing.  When he gets swelling and hives he will take some Benadryl over-the-counter.  He has never had EpiPen or emergency department visit for allergic reaction."  Assessment and Plan: Dale Hayden is a 28 y.o. male with: No problem-specific Assessment & Plan notes found for this encounter.  No follow-ups on file.  No orders of the defined types were placed in this encounter.  Lab Orders  No laboratory test(s) ordered today    Other allergy screening: Asthma: {Blank single:19197::"yes","no"} Rhino conjunctivitis: {Blank single:19197::"yes","no"} Food allergy: {Blank single:19197::"yes","no"} Medication allergy: {Blank single:19197::"yes","no"} Hymenoptera allergy: {Blank single:19197::"yes","no"} Urticaria: {Blank single:19197::"yes","no"} Eczema:{Blank single:19197::"yes","no"} History of recurrent infections suggestive of immunodeficency: {Blank single:19197::"yes","no"}  Diagnostics: Spirometry:  Tracings reviewed. His effort: {Blank single:19197::"Good reproducible efforts.","It was hard to get consistent efforts and there is a question as to whether this reflects a maximal maneuver.","Poor effort, data can not be interpreted."} FVC: ***L FEV1: ***L, ***% predicted FEV1/FVC ratio: ***% Interpretation: {Blank single:19197::"Spirometry consistent with mild obstructive disease","Spirometry consistent with moderate obstructive disease","Spirometry consistent with severe obstructive disease","Spirometry consistent with possible restrictive disease","Spirometry consistent with mixed obstructive and  restrictive disease","Spirometry uninterpretable due to [employer]  consistent with normal pattern","No overt abnormalities noted given today's efforts"}.  Please see scanned spirometry results for details.  Skin Testing: {Blank single:19197::"Select foods","Environmental allergy panel","Environmental allergy panel and select foods","Food allergy panel","None","Deferred due to recent antihistamines use"}. Positive test to: ***. Negative test to: ***.  Results discussed with patient/family.   Past Medical History: There are no problems to display for this patient.  Past Medical History:  Diagnosis Date  . Anxiety   . Bronchitis   . Mental disorder    ADHD   Past Surgical History: Past Surgical History:  Procedure Laterality Date  . I & D EXTREMITY  06/16/2011   Procedure: IRRIGATION AND DEBRIDEMENT EXTREMITY;  Surgeon: Verlee Rossetti, MD;  Location: [facility] OR;  Service: Orthopedics;  Laterality: Left;  I&D left foot with removal of foreign body   Medication List:  Current Outpatient Medications  Medication Sig Dispense Refill  . albuterol (PROVENTIL HFA;VENTOLIN HFA) 108 (90 BASE) MCG/ACT inhaler Inhale 2 puffs into the lungs every 6 (six) hours as needed for wheezing or shortness of breath. (Patient not taking: Reported on 10/04/2019) 1 Inhaler 2  . cetirizine (ZYRTEC) 10 MG tablet Take 1 tablet (10 mg total) by mouth daily. One tab daily for allergies (Patient not taking: Reported on 10/04/2019) 30 tablet 1  . diphenhydrAMINE (BENADRYL) 50 MG tablet Take 1 tablet (50 mg total) by mouth at bedtime as needed for itching. 30 tablet 0  . EPINEPHrine (EPIPEN 2-PAK) 0.3 mg/0.3 mL IJ SOAJ injection Inject 0.3 mg into the muscle as needed for anaphylaxis. 1 each 1  . ibuprofen (ADVIL,MOTRIN) 800 MG tablet Take 1 tablet (800 mg total) by mouth 3 (three) times daily. (Patient not taking: Reported on 06/30/2017) 21 tablet 0  . Multiple Vitamins-Minerals (MULTIVITAMIN WITH MINERALS) tablet  Take 1 tablet by mouth 2 (two) times daily. (Patient not taking: Reported on 10/04/2019)    . oxyCODONE-acetaminophen (PERCOCET/ROXICET) 5-325 MG tablet Take 1-2 tablets by mouth every 4 (four) hours as needed for severe pain. (Patient not taking: Reported on 06/30/2017) 6 tablet 0   No current facility-administered medications for this visit.   Allergies: Allergies  Allergen Reactions  . Peanut-Containing Drug Products Anaphylaxis, Itching and Nausea And Vomiting  . Shellfish Allergy Anaphylaxis   Social History: Social History   Socioeconomic History  . Marital status: Single    Spouse name: Not on file  . Number of children: Not on file  . Years of education: Not on file  . Highest education level: Not on file  Occupational History  . Not on file  Tobacco Use  . Smoking status: Never Smoker  . Smokeless tobacco: Never Used  Substance and Sexual Activity  . Alcohol use: Yes    Comment: RARE  . Drug use: Never  . Sexual activity: Yes  Other Topics Concern  . Not on file  Social History Narrative  . Not on file   Social Determinants of Health   Financial Resource Strain:   . Difficulty of Paying Living Expenses: Not on file  Food Insecurity:   . Worried About Programme researcher, broadcasting/film/video in the Last Year: Not on file  . Ran Out of Food in the Last Year: Not on file  Transportation Needs:   . Lack of Transportation (Medical): Not on file  . Lack of Transportation (Non-Medical): Not on file  Physical Activity:   . Days of Exercise per Week: Not on file  . Minutes of Exercise per Session: Not on file  Stress:   .  Feeling of Stress : Not on file  Social Connections:   . Frequency of Communication with Friends and Family: Not on file  . Frequency of Social Gatherings with Friends and Family: Not on file  . Attends Religious Services: Not on file  . Active Member of Clubs or Organizations: Not on file  . Attends Banker Meetings: Not on file  . Marital Status: Not  on file   Lives in a ***. Smoking: *** Occupation: ***  Environmental HistorySurveyor, minerals in the house: Copywriter, advertising in the family room: {Blank single:19197::"yes","no"} Carpet in the bedroom: {Blank single:19197::"yes","no"} Heating: {Blank single:19197::"electric","gas"} Cooling: {Blank single:19197::"central","window"} Pet: {Blank single:19197::"yes ***","no"}  Family History: No family history on file. Problem                               Relation Asthma                                   *** Eczema                                *** Food allergy                          *** Allergic rhino conjunctivitis     ***  Review of Systems  Constitutional: Negative for appetite change, chills, fever and unexpected weight change.  HENT: Negative for congestion and rhinorrhea.   Eyes: Negative for itching.  Respiratory: Negative for cough, chest tightness, shortness of breath and wheezing.   Cardiovascular: Negative for chest pain.  Gastrointestinal: Negative for abdominal pain.  Genitourinary: Negative for difficulty urinating.  Skin: Negative for rash.  Neurological: Negative for headaches.   Objective: There were no vitals taken for this visit. There is no height or weight on file to calculate BMI. Physical Exam Vitals and nursing note reviewed.  Constitutional:      Appearance: Normal appearance. He is well-developed.  HENT:     Head: Normocephalic and atraumatic.     Right Ear: External ear normal.     Left Ear: External ear normal.     Nose: Nose normal.     Mouth/Throat:     Mouth: Mucous membranes are moist.     Pharynx: Oropharynx is clear.  Eyes:     Conjunctiva/sclera: Conjunctivae normal.  Cardiovascular:     Rate and Rhythm: Normal rate and regular rhythm.     Heart sounds: Normal heart sounds. No murmur heard.  No friction rub. No gallop.   Pulmonary:     Effort: Pulmonary effort is normal.     Breath sounds: Normal  breath sounds. No wheezing, rhonchi or rales.  Abdominal:     Palpations: Abdomen is soft.  Musculoskeletal:     Cervical back: Neck supple.  Skin:    General: Skin is warm.     Findings: No rash.  Neurological:     Mental Status: He is alert and oriented to person, place, and time.  Psychiatric:        Behavior: Behavior normal.    The plan was reviewed with the patient/family, and all questions/concerned were addressed.  It was my pleasure to see Dale Hayden today and participate in his care. Please feel free to contact me with  any questions or concerns.  Sincerely,  Rexene Alberts, DO Allergy & Immunology  [facility] office: [place] office: [phone]

## 2019-12-24 NOTE — Progress Notes (Deleted)
New Patient Note  RE: Dale Hayden MRN: [id number] DOB: 05-09-1990 Date of Office Visit: 12/25/2019  Referring provider: Jac Canavan, PA-C Primary care provider: Jac Canavan, PA-C  Chief Complaint: No chief complaint on file.  History of Present Illness: I had the pleasure of seeing Dale Hayden for initial evaluation at the [facility] on 12/24/2019. He is a 29 y.o. male, who is referred here by Jac Canavan, PA-C for the evaluation of allergic reactions, angioedema.   Rash started about *** ago. Mainly occurs on his ***. Describes them as ***. Individual rashes lasts about ***. No ecchymosis upon resolution. Associated symptoms include: ***. Suspected triggers are ***. Denies any *** fevers, chills, changes in medications, foods, personal care products or recent infections. He has tried the following therapies: *** with *** benefit. Systemic steroids ***. Currently on ***.  Previous work up includes: ***. Previous history of rash/hives: ***. Patient is up to date with the following cancer screening tests: ***.  He reports food allergy to ***. The reaction occurred at the age of ***, after he ate *** amount of ***. Symptoms started within *** and was in the form of *** hives, swelling, wheezing, abdominal pain, diarrhea, vomiting. ***Denies any associated cofactors such as exertion, infection, NSAID use, or alcohol consumption. The symptoms lasted for ***. He was evaluated in ED and received ***. Since this episode, he does *** not report other accidental exposures to ***. He does *** not have access to epinephrine autoinjector and *** needed to use it.   Past work up includes: immunocap which showed *** and skin prick testing which showed ***.  Dietary History: patient has been eating other foods including ***milk, ***eggs, ***peanut, ***treenuts, ***sesame, ***shellfish, ***fish, ***soy, ***wheat, ***meats, ***fruits and ***vegetables.  He reports  reading labels and avoiding *** in diet completely. He tolerates ***baked egg and baked milk products.   10/04/2019 PCP visit: "Here as a new patient for concern of allergies.  He notes having concerns about several food allergies.  Sometimes in the past he has gotten swelling and hives inside the mouth and swelling of the tongue and lips and face eating nuts and strep.  However other times when eating Snickers bars really candy bars with nuts in it he does not have a problem.  Similarly, he can tolerate some fish, but reacts to tilapia.  Sometimes he can eat a few blueberries and not have any issue and sometimes he gets the same symptoms with blueberries and pears or the skin of fruits and vegetables.  So he is not sure if he is actually allergic to the items are not or the skin of the fruit or some chemical on the produce.  He does washes fruits and vegetables.  Eating carrots and steroids have at times giving him problems.  He has never had allergy testing.  When he gets swelling and hives he will take some Benadryl over-the-counter.  He has never had EpiPen or emergency department visit for allergic reaction."  Assessment and Plan: Dale Hayden is a 29 y.o. male with: No problem-specific Assessment & Plan notes found for this encounter.  No follow-ups on file.  No orders of the defined types were placed in this encounter.  Lab Orders  No laboratory test(s) ordered today    Other allergy screening: Asthma: {Blank single:19197::"yes","no"} Rhino conjunctivitis: {Blank single:19197::"yes","no"} Food allergy: {Blank single:19197::"yes","no"} Medication allergy: {Blank single:19197::"yes","no"} Hymenoptera allergy: {Blank single:19197::"yes","no"} Urticaria: {Blank single:19197::"yes","no"} Eczema:{Blank single:19197::"yes","no"} History of  recurrent infections suggestive of immunodeficency: {Blank single:19197::"yes","no"}  Diagnostics: Spirometry:  Tracings reviewed. His effort: {Blank  single:19197::"Good reproducible efforts.","It was hard to get consistent efforts and there is a question as to whether this reflects a maximal maneuver.","Poor effort, data can not be interpreted."} FVC: ***L FEV1: ***L, ***% predicted FEV1/FVC ratio: ***% Interpretation: {Blank single:19197::"Spirometry consistent with mild obstructive disease","Spirometry consistent with moderate obstructive disease","Spirometry consistent with severe obstructive disease","Spirometry consistent with possible restrictive disease","Spirometry consistent with mixed obstructive and restrictive disease","Spirometry uninterpretable due to technique","Spirometry consistent with normal pattern","No overt abnormalities noted given today's efforts"}.  Please see scanned spirometry results for details.  Skin Testing: {Blank single:19197::"Select foods","Environmental allergy panel","Environmental allergy panel and select foods","Food allergy panel","None","Deferred due to recent antihistamines use"}. Positive test to: ***. Negative test to: ***.  Results discussed with patient/family.   Past Medical History: There are no problems to display for this patient.  Past Medical History:  Diagnosis Date  . Anxiety   . Bronchitis   . Mental disorder    ADHD   Past Surgical History: Past Surgical History:  Procedure Laterality Date  . I & D EXTREMITY  06/16/2011   Procedure: IRRIGATION AND DEBRIDEMENT EXTREMITY;  Surgeon: Verlee RossettiSteven R Norris, MD;  [facility];  Service: Orthopedics;  Laterality: Left;  I&D left foot with removal of foreign body   Medication List:  Current Outpatient Medications  Medication Sig Dispense Refill  . albuterol (PROVENTIL HFA;VENTOLIN HFA) 108 (90 BASE) MCG/ACT inhaler Inhale 2 puffs into the lungs every 6 (six) hours as needed for wheezing or shortness of breath. (Patient not taking: Reported on 10/04/2019) 1 Inhaler 2  . cetirizine (ZYRTEC) 10 MG tablet Take 1 tablet (10 mg total) by mouth  daily. One tab daily for allergies (Patient not taking: Reported on 10/04/2019) 30 tablet 1  . diphenhydrAMINE (BENADRYL) 50 MG tablet Take 1 tablet (50 mg total) by mouth at bedtime as needed for itching. 30 tablet 0  . EPINEPHrine (EPIPEN 2-PAK) 0.3 mg/0.3 mL IJ SOAJ injection Inject 0.3 mg into the muscle as needed for anaphylaxis. 1 each 1  . ibuprofen (ADVIL,MOTRIN) 800 MG tablet Take 1 tablet (800 mg total) by mouth 3 (three) times daily. (Patient not taking: Reported on 06/30/2017) 21 tablet 0  . Multiple Vitamins-Minerals (MULTIVITAMIN WITH MINERALS) tablet Take 1 tablet by mouth 2 (two) times daily. (Patient not taking: Reported on 10/04/2019)    . oxyCODONE-acetaminophen (PERCOCET/ROXICET) 5-325 MG tablet Take 1-2 tablets by mouth every 4 (four) hours as needed for severe pain. (Patient not taking: Reported on 06/30/2017) 6 tablet 0   No current facility-administered medications for this visit.   Allergies: Allergies  Allergen Reactions  . Peanut-Containing Drug Products Anaphylaxis, Itching and Nausea And Vomiting  . Shellfish Allergy Anaphylaxis   Social History: Social History   Socioeconomic History  . Marital status: Single    Spouse name: Not on file  . Number of children: Not on file  . Years of education: Not on file  . Highest education level: Not on file  Occupational History  . Not on file  Tobacco Use  . Smoking status: Never Smoker  . Smokeless tobacco: Never Used  Substance and Sexual Activity  . Alcohol use: Yes    Comment: RARE  . Drug use: Never  . Sexual activity: Yes  Other Topics Concern  . Not on file  Social History Narrative  . Not on file   Social Determinants of Health   Financial Resource Strain:   .  Difficulty of Paying Living Expenses: Not on file  Food Insecurity:   . Worried About Programme researcher, broadcasting/film/video in the Last Year: Not on file  . Ran Out of Food in the Last Year: Not on file  Transportation Needs:   . Lack of Transportation  (Medical): Not on file  . Lack of Transportation (Non-Medical): Not on file  Physical Activity:   . Days of Exercise per Week: Not on file  . Minutes of Exercise per Session: Not on file  Stress:   . Feeling of Stress : Not on file  Social Connections:   . Frequency of Communication with Friends and Family: Not on file  . Frequency of Social Gatherings with Friends and Family: Not on file  . Attends Religious Services: Not on file  . Active Member of Clubs or Organizations: Not on file  . Attends Banker Meetings: Not on file  . Marital Status: Not on file   Lives in a ***. Smoking: *** Occupation: ***  Environmental HistorySurveyor, minerals in the house: Copywriter, advertising in the family room: {Blank single:19197::"yes","no"} Carpet in the bedroom: {Blank single:19197::"yes","no"} Heating: {Blank single:19197::"electric","gas"} Cooling: {Blank single:19197::"central","window"} Pet: {Blank single:19197::"yes ***","no"}  Family History: No family history on file. Problem                               Relation Asthma                                   *** Eczema                                *** Food allergy                          *** Allergic rhino conjunctivitis     ***  Review of Systems  Constitutional: Negative for appetite change, chills, fever and unexpected weight change.  HENT: Negative for congestion and rhinorrhea.   Eyes: Negative for itching.  Respiratory: Negative for cough, chest tightness, shortness of breath and wheezing.   Cardiovascular: Negative for chest pain.  Gastrointestinal: Negative for abdominal pain.  Genitourinary: Negative for difficulty urinating.  Skin: Negative for rash.  Neurological: Negative for headaches.   Objective: There were no vitals taken for this visit. There is no height or weight on file to calculate BMI. Physical Exam Vitals and nursing note reviewed.  Constitutional:       Appearance: Normal appearance. He is well-developed.  HENT:     Head: Normocephalic and atraumatic.     Right Ear: External ear normal.     Left Ear: External ear normal.     Nose: Nose normal.     Mouth/Throat:     Mouth: Mucous membranes are moist.     Pharynx: Oropharynx is clear.  Eyes:     Conjunctiva/sclera: Conjunctivae normal.  Cardiovascular:     Rate and Rhythm: Normal rate and regular rhythm.     Heart sounds: Normal heart sounds. No murmur heard.  No friction rub. No gallop.   Pulmonary:     Effort: Pulmonary effort is normal.     Breath sounds: Normal breath sounds. No wheezing, rhonchi or rales.  Abdominal:     Palpations: Abdomen is soft.  Musculoskeletal:     Cervical back: Neck supple.  Skin:    General: Skin is warm.     Findings: No rash.  Neurological:     Mental Status: He is alert and oriented to person, place, and time.  Psychiatric:        Behavior: Behavior normal.    The plan was reviewed with the patient/family, and all questions/concerned were addressed.  It was my pleasure to see Dale today and participate in his care. Please feel free to contact me with any questions or concerns.  Sincerely,  Wyline Mood, DO Allergy & Immunology  [facility] office: [phone] [facility] office: [phone]

## 2019-12-25 ENCOUNTER — Ambulatory Visit: Payer: Managed Care, Other (non HMO) | Admitting: Allergy

## 2020-04-28 ENCOUNTER — Telehealth: Payer: Self-pay

## 2020-04-28 NOTE — Telephone Encounter (Signed)
Recv'd fax that generic epi pen cost over $150 & pt can't afford and brand requires P.A. but has discount card for $0 co pay  P.A. Dale Hayden brand completed & was denied, gave option of Symjepi (ok to switch per Vincenza Hews)  Called into pharmacy & required P.A. also This was completed

## 2020-05-02 NOTE — Telephone Encounter (Signed)
P.A. Lenoard Aden also denied.

## 2020-07-20 ENCOUNTER — Telehealth: Payer: Self-pay

## 2020-07-20 NOTE — Telephone Encounter (Signed)
Called pt to see if he got the Epi pen yet and offer discount card and or Pt Assistance option

## 2021-03-17 ENCOUNTER — Encounter: Payer: Self-pay | Admitting: Internal Medicine

## 2022-04-15 DIAGNOSIS — J029 Acute pharyngitis, unspecified: Secondary | ICD-10-CM | POA: Diagnosis not present

## 2022-04-15 DIAGNOSIS — J302 Other seasonal allergic rhinitis: Secondary | ICD-10-CM | POA: Diagnosis not present

## 2022-04-15 DIAGNOSIS — Z6822 Body mass index (BMI) 22.0-22.9, adult: Secondary | ICD-10-CM | POA: Diagnosis not present

## 2022-05-16 DIAGNOSIS — L02611 Cutaneous abscess of right foot: Secondary | ICD-10-CM | POA: Diagnosis not present

## 2022-05-16 DIAGNOSIS — L03031 Cellulitis of right toe: Secondary | ICD-10-CM | POA: Diagnosis not present

## 2022-05-16 DIAGNOSIS — L600 Ingrowing nail: Secondary | ICD-10-CM | POA: Diagnosis not present

## 2023-02-21 ENCOUNTER — Emergency Department (HOSPITAL_COMMUNITY)
Admission: EM | Admit: 2023-02-21 | Discharge: 2023-02-21 | Disposition: A | Discharge status: Home / Self Care | Payer: 59 | Attending: Emergency Medicine | Admitting: Emergency Medicine

## 2023-02-21 ENCOUNTER — Encounter (HOSPITAL_COMMUNITY): Payer: Self-pay | Admitting: Emergency Medicine

## 2023-02-21 DIAGNOSIS — Z91010 Allergy to peanuts: Secondary | ICD-10-CM | POA: Diagnosis not present

## 2023-02-21 DIAGNOSIS — Z113 Encounter for screening for infections with a predominantly sexual mode of transmission: Secondary | ICD-10-CM | POA: Insufficient documentation

## 2023-02-21 NOTE — ED Provider Notes (Signed)
[place] EMERGENCY DEPARTMENT AT [facility] Provider Note   CSN: [id number] Arrival date & time: 02/21/23  1524     History  No chief complaint on file.   Dale Hayden is a 33 y.o. male with overall noncontributory past medical history who presents with concern for asymptomatic desire for STI screening.  He requests gonorrhea, chlamydia testing.  Also reports possible exposure to HPV virus and HSV virus.  Denies any symptoms, ulcers, genital warts.  Denies any penile discharge, dysuria, fever, chills.  HPI     Home Medications Prior to Admission medications   Medication Sig Start Date End Date Taking? Authorizing Provider  albuterol (PROVENTIL HFA;VENTOLIN HFA) 108 (90 BASE) MCG/ACT inhaler Inhale 2 puffs into the lungs every 6 (six) hours as needed for wheezing or shortness of breath. Patient not taking: Reported on 10/04/2019 05/20/14   Rodolph Bong, MD  cetirizine (ZYRTEC) 10 MG tablet Take 1 tablet (10 mg total) by mouth daily. One tab daily for allergies Patient not taking: Reported on 10/04/2019 09/12/14   Linna Hoff, MD  diphenhydrAMINE (BENADRYL) 50 MG tablet Take 1 tablet (50 mg total) by mouth at bedtime as needed for itching. 10/04/19   Tysinger, Kermit Balo, PA-C  EPINEPHrine (EPIPEN 2-PAK) 0.3 mg/0.3 mL IJ SOAJ injection Inject 0.3 mg into the muscle as needed for anaphylaxis. 10/04/19   Tysinger, Kermit Balo, PA-C  ibuprofen (ADVIL,MOTRIN) 800 MG tablet Take 1 tablet (800 mg total) by mouth 3 (three) times daily. Patient not taking: Reported on 06/30/2017 01/03/15   Anselm Pancoast, PA-C  Multiple Vitamins-Minerals (MULTIVITAMIN WITH MINERALS) tablet Take 1 tablet by mouth 2 (two) times daily. Patient not taking: Reported on 10/04/2019    [provider]  oxyCODONE-acetaminophen (PERCOCET/ROXICET) 5-325 MG tablet Take 1-2 tablets by mouth every 4 (four) hours as needed for severe pain. Patient not taking: Reported on 06/30/2017 01/03/15   Anselm Pancoast, PA-C       Allergies    Peanut-containing drug products and Shellfish allergy    Review of Systems   Review of Systems  All other systems reviewed and are negative.   Physical Exam Updated Vital Signs BP (!) 132/104   Pulse 73   Temp 98.2 F (36.8 C) (Oral)   Resp 16   Ht 5\' 9"  (1.753 m)   Wt 73.5 kg   SpO2 100%   BMI 23.92 kg/m  Physical Exam Vitals and nursing note reviewed.  Constitutional:      General: He is not in acute distress.    Appearance: Normal appearance.  HENT:     Head: Normocephalic and atraumatic.  Eyes:     General:        Right eye: No discharge.        Left eye: No discharge.  Cardiovascular:     Rate and Rhythm: Normal rate and regular rhythm.     Heart sounds: No murmur heard.    No friction rub. No gallop.  Pulmonary:     Effort: Pulmonary effort is normal.     Breath sounds: Normal breath sounds.  Abdominal:     General: Bowel sounds are normal.     Palpations: Abdomen is soft.  Genitourinary:    Comments: Deferred genital exam as patient is asymptomatic at this time. Skin:    General: Skin is warm and dry.     Capillary Refill: Capillary refill takes less than 2 seconds.  Neurological:     Mental Status: He  is alert and oriented to person, place, and time.  Psychiatric:        Mood and Affect: Mood normal.        Behavior: Behavior normal.     ED Results / Procedures / Treatments   Labs (all labs ordered are listed, but only abnormal results are displayed) Labs Reviewed  HSV 1 ANTIBODY, IGG  HSV 2 ANTIBODY, IGG  GC/CHLAMYDIA PROBE AMP ([place]) NOT AT [facility]    EKG None  Radiology No results found.  Procedures Procedures    Medications Ordered in ED Medications - No data to display  ED Course/ Medical Decision Making/ A&P                                 Medical Decision Making Amount and/or Complexity of Data Reviewed Labs: ordered.   This is an overall well-appearing 33 year old male who presents concern for  asymptomatic STI screening.  He is requesting gonorrhea, chlamydia, HPV and HSV screen.  Discussed that we do not have blood titers for HPV, and no other way to test that he is asymptomatic this time.  Discussed that I can screen him with blood test for herpes simplex but that he may be an asymptomatic carrier at this is not necessarily indicative of an active genital herpes infection.  He is asymptomatic this time so genital exam was deferred.  Discussed that his results will appear on his patient chart and 24 to 48 hours, he can return for treatment as needed.  Patient understands and agrees to plan, and is discharged in stable condition at this time. Final Clinical Impression(s) / ED Diagnoses Final diagnoses:  Screening examination for STI    Rx / DC Orders ED Discharge Orders     None         [place] 02/21/23 1626    Maia Plan, MD 02/24/23 1547

## 2023-02-21 NOTE — Discharge Instructions (Signed)
Please follow-up on your patient portal for the results of your STI screening, the results of your gonorrhea, chlamydia swab and herpes titers should result in around 24 to 48 hours.  As we discussed even if the results are positive for HSV 1 or 2 this does not necessarily mean that you have genital herpes, these viruses can also be spread orally, and can be asymptomatic for years without symptoms.  You may want to follow-up for a viral swab if you do develop an ulcer of the genitals.  Please inform any of your sexual partners of your screening results.  If you are positive for gonorrhea, chlamydia please return for antibiotic treatment.

## 2023-02-21 NOTE — ED Triage Notes (Signed)
Pt here from home wanting to to checked for std 's , no symptoms currently

## 2023-02-23 LAB — HSV 2 ANTIBODY, IGG: HSV 2 Glycoprotein G Ab, IgG: NONREACTIVE

## 2023-02-23 LAB — HSV 1 ANTIBODY, IGG: HSV 1 Glycoprotein G Ab, IgG: NONREACTIVE
